# Patient Record
Sex: Female | Born: 1980 | Race: White | Hispanic: No | Smoking: Never smoker
Health system: Southern US, Community
[De-identification: ages and names within clinical notes are randomized; demographics above are authoritative.]

## PROBLEM LIST (undated history)

## (undated) DIAGNOSIS — K219 Gastro-esophageal reflux disease without esophagitis: Secondary | ICD-10-CM

## (undated) DIAGNOSIS — Z87442 Personal history of urinary calculi: Secondary | ICD-10-CM

## (undated) DIAGNOSIS — N201 Calculus of ureter: Secondary | ICD-10-CM

---

## 2012-08-10 DIAGNOSIS — Z87442 Personal history of urinary calculi: Secondary | ICD-10-CM

## 2012-08-10 HISTORY — DX: Personal history of urinary calculi: Z87.442

## 2013-08-10 HISTORY — PX: EXTRACORPOREAL SHOCK WAVE LITHOTRIPSY: SHX1557

## 2013-08-10 HISTORY — PX: CYSTOSCOPY WITH URETEROSCOPY AND STENT PLACEMENT: SHX6377

## 2018-05-10 ENCOUNTER — Other Ambulatory Visit: Payer: Self-pay | Admitting: Urology

## 2018-05-10 ENCOUNTER — Ambulatory Visit (HOSPITAL_COMMUNITY): Payer: Medicaid Other

## 2018-05-10 ENCOUNTER — Ambulatory Visit (HOSPITAL_COMMUNITY): Payer: Medicaid Other | Admitting: Anesthesiology

## 2018-05-10 ENCOUNTER — Ambulatory Visit (HOSPITAL_COMMUNITY)
Admission: AD | Admit: 2018-05-10 | Discharge: 2018-05-10 | Disposition: A | Discharge status: Home / Self Care | Payer: Medicaid Other | Source: Other Acute Inpatient Hospital | Attending: Urology | Admitting: Urology

## 2018-05-10 ENCOUNTER — Encounter (HOSPITAL_COMMUNITY)
Admission: AD | Disposition: A | Discharge status: Home / Self Care | Payer: Self-pay | Source: Other Acute Inpatient Hospital | Attending: Urology

## 2018-05-10 ENCOUNTER — Encounter (HOSPITAL_COMMUNITY): Payer: Self-pay | Admitting: *Deleted

## 2018-05-10 DIAGNOSIS — Z79899 Other long term (current) drug therapy: Secondary | ICD-10-CM | POA: Insufficient documentation

## 2018-05-10 DIAGNOSIS — Z87442 Personal history of urinary calculi: Secondary | ICD-10-CM | POA: Insufficient documentation

## 2018-05-10 DIAGNOSIS — N132 Hydronephrosis with renal and ureteral calculous obstruction: Secondary | ICD-10-CM | POA: Diagnosis not present

## 2018-05-10 DIAGNOSIS — N201 Calculus of ureter: Secondary | ICD-10-CM | POA: Diagnosis present

## 2018-05-10 DIAGNOSIS — N200 Calculus of kidney: Secondary | ICD-10-CM

## 2018-05-10 HISTORY — DX: Personal history of urinary calculi: Z87.442

## 2018-05-10 HISTORY — PX: CYSTOSCOPY W/ URETERAL STENT PLACEMENT: SHX1429

## 2018-05-10 LAB — HCG, SERUM, QUALITATIVE: PREG SERUM: NEGATIVE

## 2018-05-10 SURGERY — CYSTOSCOPY, WITH RETROGRADE PYELOGRAM AND URETERAL STENT INSERTION
Anesthesia: General | Site: Ureter | Laterality: Left

## 2018-05-10 MED ORDER — MIDAZOLAM HCL 2 MG/2ML IJ SOLN
INTRAMUSCULAR | Status: AC
  Filled 2018-05-10: qty 2

## 2018-05-10 MED ORDER — PROPOFOL 10 MG/ML IV BOLUS
INTRAVENOUS | Status: DC | PRN
  Administered 2018-05-10: 40 mg via INTRAVENOUS
  Administered 2018-05-10: 150 mg via INTRAVENOUS

## 2018-05-10 MED ORDER — CEFAZOLIN SODIUM-DEXTROSE 2-4 GM/100ML-% IV SOLN
INTRAVENOUS | Status: AC
  Filled 2018-05-10: qty 100

## 2018-05-10 MED ORDER — FENTANYL CITRATE (PF) 100 MCG/2ML IJ SOLN: 25.0000 ug | INTRAMUSCULAR | Status: DC | PRN

## 2018-05-10 MED ORDER — BELLADONNA ALKALOIDS-OPIUM 16.2-60 MG RE SUPP
RECTAL | Status: DC | PRN
  Administered 2018-05-10: 1 via RECTAL

## 2018-05-10 MED ORDER — DEXAMETHASONE SODIUM PHOSPHATE 10 MG/ML IJ SOLN
INTRAMUSCULAR | Status: DC | PRN
  Administered 2018-05-10: 10 mg via INTRAVENOUS

## 2018-05-10 MED ORDER — ONDANSETRON HCL 4 MG/2ML IJ SOLN
INTRAMUSCULAR | Status: DC | PRN
  Administered 2018-05-10: 4 mg via INTRAVENOUS

## 2018-05-10 MED ORDER — PHENAZOPYRIDINE HCL 200 MG PO TABS: 200.0000 mg | ORAL_TABLET | Freq: Three times a day (TID) | ORAL | 0 refills | Status: DC | PRN

## 2018-05-10 MED ORDER — PHENYLEPHRINE 40 MCG/ML (10ML) SYRINGE FOR IV PUSH (FOR BLOOD PRESSURE SUPPORT)
PREFILLED_SYRINGE | INTRAVENOUS | Status: DC | PRN
  Administered 2018-05-10 (×4): 80 ug via INTRAVENOUS

## 2018-05-10 MED ORDER — OXYCODONE HCL 5 MG/5ML PO SOLN
5.0000 mg | Freq: Once | ORAL | Status: DC | PRN
  Filled 2018-05-10: qty 5

## 2018-05-10 MED ORDER — CEFAZOLIN SODIUM-DEXTROSE 2-4 GM/100ML-% IV SOLN: 2.0000 g | INTRAVENOUS | Status: DC

## 2018-05-10 MED ORDER — SODIUM CHLORIDE 0.9 % IR SOLN
Status: DC | PRN
  Administered 2018-05-10: 3000 mL

## 2018-05-10 MED ORDER — OXYCODONE HCL 5 MG PO TABS: 5.0000 mg | ORAL_TABLET | Freq: Once | ORAL | Status: DC | PRN

## 2018-05-10 MED ORDER — BELLADONNA ALKALOIDS-OPIUM 16.2-30 MG RE SUPP
RECTAL | Status: AC
  Filled 2018-05-10: qty 1

## 2018-05-10 MED ORDER — LACTATED RINGERS IV SOLN
INTRAVENOUS | Status: DC
  Administered 2018-05-10: 16:00:00 via INTRAVENOUS

## 2018-05-10 MED ORDER — PROPOFOL 10 MG/ML IV BOLUS
INTRAVENOUS | Status: AC
  Filled 2018-05-10: qty 20

## 2018-05-10 MED ORDER — FENTANYL CITRATE (PF) 100 MCG/2ML IJ SOLN
INTRAMUSCULAR | Status: AC
  Filled 2018-05-10: qty 2

## 2018-05-10 MED ORDER — MIDAZOLAM HCL 5 MG/5ML IJ SOLN
INTRAMUSCULAR | Status: DC | PRN
  Administered 2018-05-10: 2 mg via INTRAVENOUS

## 2018-05-10 MED ORDER — LIDOCAINE HCL URETHRAL/MUCOSAL 2 % EX GEL
CUTANEOUS | Status: AC
  Filled 2018-05-10: qty 5

## 2018-05-10 MED ORDER — LIDOCAINE 2% (20 MG/ML) 5 ML SYRINGE
INTRAMUSCULAR | Status: DC | PRN
  Administered 2018-05-10: 60 mg via INTRAVENOUS

## 2018-05-10 MED ORDER — TRAMADOL HCL 50 MG PO TABS: 50.0000 mg | ORAL_TABLET | Freq: Four times a day (QID) | ORAL | 0 refills | Status: DC | PRN

## 2018-05-10 MED ORDER — ONDANSETRON HCL 4 MG/2ML IJ SOLN
INTRAMUSCULAR | Status: AC
  Filled 2018-05-10: qty 2

## 2018-05-10 MED ORDER — LIDOCAINE HCL URETHRAL/MUCOSAL 2 % EX GEL
CUTANEOUS | Status: DC | PRN
  Administered 2018-05-10: 1

## 2018-05-10 MED ORDER — FENTANYL CITRATE (PF) 100 MCG/2ML IJ SOLN
INTRAMUSCULAR | Status: DC | PRN
  Administered 2018-05-10: 50 ug via INTRAVENOUS

## 2018-05-10 MED ORDER — LIDOCAINE 2% (20 MG/ML) 5 ML SYRINGE
INTRAMUSCULAR | Status: AC
  Filled 2018-05-10: qty 5

## 2018-05-10 SURGICAL SUPPLY — 17 items
BAG URO CATCHER STRL LF (MISCELLANEOUS) ×2 IMPLANT
BASKET ZERO TIP NITINOL 2.4FR (BASKET) IMPLANT
CATH URET 5FR 28IN OPEN ENDED (CATHETERS) ×2 IMPLANT
CLOTH BEACON ORANGE TIMEOUT ST (SAFETY) ×2 IMPLANT
EXTRACTOR STONE 1.7FRX115CM (UROLOGICAL SUPPLIES) IMPLANT
GLOVE BIOGEL M STRL SZ7.5 (GLOVE) ×2 IMPLANT
GOWN STRL REUS W/TWL XL LVL3 (GOWN DISPOSABLE) ×2 IMPLANT
GUIDEWIRE ANG ZIPWIRE 038X150 (WIRE) IMPLANT
GUIDEWIRE STR DUAL SENSOR (WIRE) ×2 IMPLANT
MANIFOLD NEPTUNE II (INSTRUMENTS) ×2 IMPLANT
PACK CYSTO (CUSTOM PROCEDURE TRAY) ×2 IMPLANT
SHEATH URETERAL 12FRX28CM (UROLOGICAL SUPPLIES) IMPLANT
SHEATH URETERAL 12FRX35CM (MISCELLANEOUS) IMPLANT
STENT URET 6FRX24 CONTOUR (STENTS) ×2 IMPLANT
TUBING CONNECTING 10 (TUBING) ×2 IMPLANT
TUBING UROLOGY SET (TUBING) IMPLANT
WIRE COONS/BENSON .038X145CM (WIRE) IMPLANT

## 2018-05-10 NOTE — H&P (Signed)
Acute Kidney Stone  HPI: Kirsten West is a 37 year-old female patient who is here for further eval and management of kidney stones.  She was diagnosed with a kidney stone on 05/09/2018. The patient presented to [facility] with symptoms of a kidney stone.   Her pain started about 05/07/2018. The pain is on both sides.   The patient underwent CT scan prior to today's appointment.   The patient relates initially having nausea, flank pain, and fever. She is currently having flank pain. She denies having back pain, groin pain, nausea, vomiting, fever, chills, and voiding symptoms. She has not caught a stone in her urine strainer since her symptoms began.   She has had ESWL and Ureteral Stent for treatment of her stones in the past. This is not her first kidney stone.   History of cystine stones.  Patient has been seen and managed by Dr. Nechama Guard, but recently has not had any follow-up with her urologist. She has had numerous stone surgery since 2014. Prior to this, she did not have any history of kidney stones. She was diagnosed with stones. However, she is not taking any medications for this is currently.   The patient's pain is predominantly in the left flank region. She denies any fevers or chills. She has had some associated nausea and vomiting.     ALLERGIES: None   MEDICATIONS: Percocet 5 mg-325 mg tablet  Tamsulosin Hcl 0.4 mg capsule     GU PSH: No GU PSH    NON-GU PSH: No Non-GU PSH    GU PMH: No GU PMH    NON-GU PMH: No Non-GU PMH    FAMILY HISTORY: nephrolithiasis - Brother, Sister   SOCIAL HISTORY: Marital Status: Single Preferred Language: English; Ethnicity: Not Hispanic Or Latino; Race: White Current Smoking Status: Patient has never smoked.   Tobacco Use Assessment Completed: Used Tobacco in last 30 days? Has never drank.  Drinks 1 caffeinated drink per day. Patient's occupation is/was Unemployed.    REVIEW OF SYSTEMS:    GU Review Female:   Patient reports  get up at night to urinate and stream starts and stops. Patient denies frequent urination, hard to postpone urination, burning /pain with urination, leakage of urine, trouble starting your stream, have to strain to urinate, and being pregnant.  Gastrointestinal (Upper):   Patient denies nausea, vomiting, and indigestion/ heartburn.  Gastrointestinal (Lower):   Patient denies diarrhea and constipation.  Constitutional:   Patient denies fever, night sweats, weight loss, and fatigue.  Skin:   Patient denies skin rash/ lesion and itching.  Eyes:   Patient reports blurred vision. Patient denies double vision.  Ears/ Nose/ Throat:   Patient denies sore throat and sinus problems.  Hematologic/Lymphatic:   Patient denies easy bruising and swollen glands.  Cardiovascular:   Patient denies leg swelling and chest pains.  Respiratory:   Patient denies cough and shortness of breath.  Endocrine:   Patient reports excessive thirst.   Musculoskeletal:   Patient denies back pain and joint pain.  Neurological:   Patient denies headaches and dizziness.  Psychologic:   Patient denies depression and anxiety.   VITAL SIGNS:      05/10/2018 02:09 PM  Weight 121 lb / 54.88 kg  BP 99/70 mmHg  Pulse 103 /min  Temperature 100.1 F / 37.8 C   MULTI-SYSTEM PHYSICAL EXAMINATION:    Constitutional: Well-nourished. No physical deformities. Normally developed. Good grooming.  Neck: Neck symmetrical, not swollen. Normal tracheal position.  Respiratory: Normal  breath sounds. No labored breathing, no use of accessory muscles.   Cardiovascular: Regular rate and rhythm. No murmur, no gallop. Normal temperature, normal extremity pulses, no swelling, no varicosities.   Lymphatic: No enlargement of neck, axillae, groin.  Skin: No paleness, no jaundice, no cyanosis. No lesion, no ulcer, no rash.  Neurologic / Psychiatric: Oriented to time, oriented to place, oriented to person. No depression, no anxiety, no agitation.   Gastrointestinal: No mass, no tenderness, no rigidity, non obese abdomen.  Eyes: Normal conjunctivae. Normal eyelids.  Ears, Nose, Mouth, and Throat: Left ear no scars, no lesions, no masses. Right ear no scars, no lesions, no masses. Nose no scars, no lesions, no masses. Normal hearing. Normal lips.  Musculoskeletal: Normal gait and station of head and neck.     PAST DATA REVIEWED:  Source Of History:  Patient  Records Review:   Previous Doctor Records, Previous Patient Records, POC Tool  X-Ray Review: C.T. Abdomen/Pelvis: Reviewed Films.     PROCEDURES:          Urinalysis w/Scope - 81001 Dipstick Dipstick Cont'd Micro  Color: Yellow Bilirubin: Neg WBC/hpf: 6 - 10/hpf  Appearance: Cloudy Ketones: Neg RBC/hpf: 10 - 20/hpf  Specific Gravity: 1.025 Blood: 2+ Bacteria: Mod (26-50/hpf)  pH: 5.5 Protein: 2+ Cystals: NS (Not Seen)  Glucose: Neg Urobilinogen: 0.2 Casts: NS (Not Seen)    Nitrites: Neg Trichomonas: Not Present    Leukocyte Esterase: Trace Mucous: Present      Epithelial Cells: 10 - 20/hpf      Yeast: NS (Not Seen)      Sperm: Not Present    Notes:            Ketoralac 60mg  - [id number], [id number] Qty: 60 Adm. By: Lyndal Rainbow  Unit: mg Lot No [id number]  Route: IM Exp. Date 09/11/2019  Freq: None Mfgr.:   Site: Left Buttock   ASSESSMENT:      ICD-10 Details  1 GU:   Ureteral calculus - N20.1    PLAN:           Document Letter(s):  Created for Patient: Clinical Summary         Notes:   The patient has an 8 mm left proximal ureteral stone that she is unlikely to pass. I recommended that we place a stent urgently tonight and then have the patient follow-up in several weeks for completion ureteroscopy. The patient is patent this process before, and is familiar with the risk and the benefits. All questions were answered to plan to get this scheduled so that we can proceed to the operating room urgently.

## 2018-05-10 NOTE — Op Note (Addendum)
PATIENT:  Kirsten West  Preoperative diagnosis:  1. Left proximal ureteral stone   Postoperative diagnosis:  1. Left proximal ureteral stone   Procedure: 1. Cystoscopy 2. Left ureteral stent placement (71F x 24 cm JJ) without string 3. Left retrograde pyelography with interpretation   Surgeon: Berniece Salines, M.D. Kevan Rosebush, M.D. (Resident)  Anesthesia: General  Complications: None  EBL: Minimal  Specimens: None  Indication: Kirsten West is a 37 y.o. female with a history of cystine stones s/p prior SWL. She was recently diagnosed with an 8mm left proximal ureteral stone with associated pain, nausea, and fever. After reviewing the management options for treatment, they have elected to proceed with the above surgical procedure(s). We have discussed the potential benefits and risks of the procedure, side effects of the proposed treatment, the likelihood of the patient achieving the goals of the procedure, and any potential problems that might occur during the procedure or recuperation. Informed consent has been obtained.  Findings:  Left proximal ureteral stone visible on KUB and RPG Slightly turbid but nonpurulent hydronephrotic drip-- sent for urine culture Successful left ureteral stent placement without string  Description of procedure:   The patient was taken to the operating room and general anesthesia was induced.  The patient was placed in the dorsal lithotomy position, prepped and draped in the usual sterile fashion, and preoperative antibiotics were administered. A preoperative time-out was performed.   Cystourethroscopy was performed.  The patient's urethra was examined and was normal. The bladder was then systematically examined in its entirety. There was no evidence for any bladder tumors, stones, or other mucosal pathology.    Attention then turned to the left ureteral orifice and a ureteral catheter was used to intubate the ureteral orifice.  There was a  turbid but nonpurulent hydronephrotic drip which was collected and sent for urine culture. Omnipaque contrast was injected through the ureteral catheter and a retrograde pyelogram which revealed the above findings.   A Sensor guidewire was then advanced up the left ureter into the renal pelvis under fluoroscopic guidance.  The wire was then backloaded through the cystoscope and a 71F x 24 cm JJ ureteral stent without string was advance over the wire using Seldinger technique.  The stent was positioned appropriately under fluoroscopic and cystoscopic guidance.  The wire was then removed with an adequate stent curl noted in the renal pelvis as well as in the bladder.  The bladder was then emptied and the procedure ended. A B&O suppository was placed. The patient appeared to tolerate the procedure well and without complications.  The patient was able to be awakened and transferred to the recovery unit in satisfactory condition.

## 2018-05-10 NOTE — Transfer of Care (Signed)
Immediate Anesthesia Transfer of Care Note  Patient: Kirsten West  Procedure(s) Performed: CYSTOSCOPY WITH RETROGRADE PYELOGRAM/URETERAL STENT PLACEMENT (Left Ureter)  Patient Location: PACU  Anesthesia Type:General  Level of Consciousness: drowsy and patient cooperative  Airway & Oxygen Therapy: Patient Spontanous Breathing  Post-op Assessment: Report given to RN and Post -op Vital signs reviewed and stable  Post vital signs: Reviewed and stable  Last Vitals:  Vitals Value Taken Time  BP 88/56 05/10/2018  7:20 PM  Temp    Pulse 100 05/10/2018  7:22 PM  Resp 21 05/10/2018  7:22 PM  SpO2 100 % 05/10/2018  7:22 PM  Vitals shown include unvalidated device data.  Last Pain:  Vitals:   05/10/18 1600  TempSrc:   PainSc: 1       Patients Stated Pain Goal: 3 (05/10/18 1600)  Complications: No apparent anesthesia complications

## 2018-05-10 NOTE — Anesthesia Preprocedure Evaluation (Signed)
Anesthesia Evaluation  Patient identified by MRN, date of birth, ID band Patient awake    Reviewed: Allergy & Precautions, NPO status , Patient's Chart, lab work & pertinent test results  History of Anesthesia Complications Negative for: history of anesthetic complications  Airway Mallampati: III  TM Distance: <3 FB Neck ROM: Full  Mouth opening: Limited Mouth Opening  Dental  (+) Teeth Intact   Pulmonary neg pulmonary ROS,    breath sounds clear to auscultation       Cardiovascular negative cardio ROS   Rhythm:Regular     Neuro/Psych negative neurological ROS  negative psych ROS   GI/Hepatic negative GI ROS, Neg liver ROS,   Endo/Other  negative endocrine ROS  Renal/GU Renal disease     Musculoskeletal negative musculoskeletal ROS (+)   Abdominal   Peds  Hematology negative hematology ROS (+)   Anesthesia Other Findings   Reproductive/Obstetrics                             Anesthesia Physical Anesthesia Plan  ASA: I  Anesthesia Plan: General   Post-op Pain Management:    Induction: Intravenous  PONV Risk Score and Plan: 3 and Ondansetron and Dexamethasone  Airway Management Planned: LMA  Additional Equipment: None  Intra-op Plan:   Post-operative Plan: Extubation in OR  Informed Consent: I have reviewed the patients History and Physical, chart, labs and discussed the procedure including the risks, benefits and alternatives for the proposed anesthesia with the patient or authorized representative who has indicated his/her understanding and acceptance.   Dental advisory given  Plan Discussed with: CRNA and Surgeon  Anesthesia Plan Comments:         Anesthesia Quick Evaluation

## 2018-05-10 NOTE — Anesthesia Postprocedure Evaluation (Signed)
Anesthesia Post Note  Patient: Kirsten West  Procedure(s) Performed: CYSTOSCOPY WITH RETROGRADE PYELOGRAM/URETERAL STENT PLACEMENT (Left Ureter)     Patient location during evaluation: PACU Anesthesia Type: General Level of consciousness: awake and alert Pain management: pain level controlled Vital Signs Assessment: post-procedure vital signs reviewed and stable Respiratory status: spontaneous breathing, nonlabored ventilation, respiratory function stable and patient connected to nasal cannula oxygen Cardiovascular status: blood pressure returned to baseline and stable Postop Assessment: no apparent nausea or vomiting Anesthetic complications: no    Last Vitals:  Vitals:   05/10/18 1945 05/10/18 2000  BP: (!) 87/77 100/70  Pulse: 89 95  Resp: (!) 24 18  Temp:  37 C  SpO2: 99% 98%    Last Pain:  Vitals:   05/10/18 2000  TempSrc:   PainSc: 0-No pain                 Lacy Taglieri S

## 2018-05-10 NOTE — Discharge Instructions (Signed)
DISCHARGE INSTRUCTIONS FOR KIDNEY STONE/URETERAL STENT   MEDICATIONS:  1.  Resume all your other meds from home - except do not take any extra narcotic pain meds that you may have at home.  2. Pyridium is to help with the burning/stinging when you urinate. 3. Tramadol is for moderate/severe pain, otherwise taking upto 1000 mg every 6 hours of plainTylenol will help treat your pain.     ACTIVITY:  1. No strenuous activity x 1week  2. No driving while on narcotic pain medications  3. Drink plenty of water  4. Continue to walk at home - you can still get blood clots when you are at home, so keep active, but don't over do it.  5. May return to work/school tomorrow or when you feel ready   BATHING:  1. You can shower and we recommend daily showers    SIGNS/SYMPTOMS TO CALL:  Please call us if you have a fever greater than 101.5, uncontrolled nausea/vomiting, uncontrolled pain, dizziness, unable to urinate, bloody urine, chest pain, shortness of breath, leg swelling, leg pain, redness around wound, drainage from wound, or any other concerns or questions.   You can reach [place] at [phone].   FOLLOW-UP:  1. You will be scheduled for removal of your stone in ~2 weeks.

## 2018-05-10 NOTE — Anesthesia Procedure Notes (Signed)
Procedure Name: LMA Insertion Date/Time: 05/10/2018 6:53 PM Performed by: Epimenio Sarin, CRNA Pre-anesthesia Checklist: Patient identified, Emergency Drugs available, Suction available, Patient being monitored and Timeout performed Patient Re-evaluated:Patient Re-evaluated prior to induction Oxygen Delivery Method: Circle system utilized Preoxygenation: Pre-oxygenation with 100% oxygen Induction Type: IV induction Ventilation: Mask ventilation without difficulty LMA: LMA inserted LMA Size: 3.0 Number of attempts: 2 Dental Injury: Teeth and Oropharynx as per pre-operative assessment  Comments: Easy mask. LMA 4 gastric port too large for patient pharyngeal opening/recessed jaw. LMA 3 easily inserted.

## 2018-05-11 ENCOUNTER — Encounter (HOSPITAL_COMMUNITY): Payer: Self-pay | Admitting: Urology

## 2018-05-12 ENCOUNTER — Other Ambulatory Visit: Payer: Self-pay | Admitting: Urology

## 2018-05-13 LAB — URINE CULTURE

## 2018-05-23 ENCOUNTER — Other Ambulatory Visit: Payer: Self-pay

## 2018-05-23 ENCOUNTER — Encounter (HOSPITAL_BASED_OUTPATIENT_CLINIC_OR_DEPARTMENT_OTHER): Payer: Self-pay | Admitting: *Deleted

## 2018-05-23 NOTE — Progress Notes (Signed)
Spoke w/ pt via phone for pre-op interview.  Npo after mn.  Arrive at 0800.  Needs istat 8.  Negative serum pregnancy dated 05-10-2018 in chart and epic.  May take tramadol/ tylenol am dos w/ sips of water.

## 2018-05-26 ENCOUNTER — Encounter (HOSPITAL_BASED_OUTPATIENT_CLINIC_OR_DEPARTMENT_OTHER)
Admission: RE | Disposition: A | Discharge status: Home / Self Care | Payer: Self-pay | Source: Ambulatory Visit | Attending: Urology

## 2018-05-26 ENCOUNTER — Encounter (HOSPITAL_BASED_OUTPATIENT_CLINIC_OR_DEPARTMENT_OTHER): Payer: Self-pay | Admitting: Anesthesiology

## 2018-05-26 ENCOUNTER — Ambulatory Visit (HOSPITAL_BASED_OUTPATIENT_CLINIC_OR_DEPARTMENT_OTHER): Payer: Medicaid Other | Admitting: Anesthesiology

## 2018-05-26 ENCOUNTER — Ambulatory Visit (HOSPITAL_BASED_OUTPATIENT_CLINIC_OR_DEPARTMENT_OTHER)
Admission: RE | Admit: 2018-05-26 | Discharge: 2018-05-26 | Disposition: A | Discharge status: Home / Self Care | Payer: Medicaid Other | Source: Ambulatory Visit | Attending: Urology | Admitting: Urology

## 2018-05-26 DIAGNOSIS — N200 Calculus of kidney: Secondary | ICD-10-CM

## 2018-05-26 DIAGNOSIS — Z79891 Long term (current) use of opiate analgesic: Secondary | ICD-10-CM | POA: Insufficient documentation

## 2018-05-26 DIAGNOSIS — N201 Calculus of ureter: Secondary | ICD-10-CM | POA: Insufficient documentation

## 2018-05-26 HISTORY — PX: CYSTOSCOPY/URETEROSCOPY/HOLMIUM LASER/STENT PLACEMENT: SHX6546

## 2018-05-26 HISTORY — DX: Calculus of ureter: N20.1

## 2018-05-26 HISTORY — DX: Gastro-esophageal reflux disease without esophagitis: K21.9

## 2018-05-26 LAB — POCT I-STAT, CHEM 8
BUN: 7 mg/dL (ref 6–20)
CHLORIDE: 104 mmol/L (ref 98–111)
CREATININE: 0.8 mg/dL (ref 0.44–1.00)
Calcium, Ion: 1.25 mmol/L (ref 1.15–1.40)
Glucose, Bld: 85 mg/dL (ref 70–99)
HEMATOCRIT: 40 % (ref 36.0–46.0)
Hemoglobin: 13.6 g/dL (ref 12.0–15.0)
POTASSIUM: 3.7 mmol/L (ref 3.5–5.1)
SODIUM: 143 mmol/L (ref 135–145)
TCO2: 29 mmol/L (ref 22–32)

## 2018-05-26 SURGERY — CYSTOSCOPY/URETEROSCOPY/HOLMIUM LASER/STENT PLACEMENT
Anesthesia: General | Laterality: Left

## 2018-05-26 MED ORDER — KETOROLAC TROMETHAMINE 30 MG/ML IJ SOLN
INTRAMUSCULAR | Status: AC
  Filled 2018-05-26: qty 1

## 2018-05-26 MED ORDER — PHENAZOPYRIDINE HCL 200 MG PO TABS: 200.0000 mg | ORAL_TABLET | Freq: Three times a day (TID) | ORAL | 0 refills | Status: DC | PRN

## 2018-05-26 MED ORDER — TRAMADOL HCL 50 MG PO TABS: 50.0000 mg | ORAL_TABLET | Freq: Four times a day (QID) | ORAL | 0 refills | Status: AC | PRN

## 2018-05-26 MED ORDER — ONDANSETRON HCL 4 MG/2ML IJ SOLN
INTRAMUSCULAR | Status: AC
  Filled 2018-05-26: qty 2

## 2018-05-26 MED ORDER — LIDOCAINE 2% (20 MG/ML) 5 ML SYRINGE
INTRAMUSCULAR | Status: DC | PRN
  Administered 2018-05-26: 80 mg via INTRAVENOUS

## 2018-05-26 MED ORDER — IOHEXOL 300 MG/ML  SOLN
INTRAMUSCULAR | Status: DC | PRN
  Administered 2018-05-26: 20 mL via URETHRAL

## 2018-05-26 MED ORDER — FENTANYL CITRATE (PF) 100 MCG/2ML IJ SOLN
INTRAMUSCULAR | Status: DC | PRN
  Administered 2018-05-26 (×2): 50 ug via INTRAVENOUS

## 2018-05-26 MED ORDER — CIPROFLOXACIN HCL 500 MG PO TABS: 500.0000 mg | ORAL_TABLET | Freq: Once | ORAL | 0 refills | Status: AC

## 2018-05-26 MED ORDER — FENTANYL CITRATE (PF) 100 MCG/2ML IJ SOLN
INTRAMUSCULAR | Status: AC
  Filled 2018-05-26: qty 2

## 2018-05-26 MED ORDER — DEXAMETHASONE SODIUM PHOSPHATE 10 MG/ML IJ SOLN
INTRAMUSCULAR | Status: DC | PRN
  Administered 2018-05-26: 10 mg via INTRAVENOUS

## 2018-05-26 MED ORDER — MIDAZOLAM HCL 2 MG/2ML IJ SOLN
INTRAMUSCULAR | Status: DC | PRN
  Administered 2018-05-26: 2 mg via INTRAVENOUS

## 2018-05-26 MED ORDER — GENTAMICIN SULFATE 40 MG/ML IJ SOLN
240.0000 mg | INTRAVENOUS | Status: AC
  Administered 2018-05-26: 240 mg via INTRAVENOUS
  Filled 2018-05-26 (×2): qty 6

## 2018-05-26 MED ORDER — PROPOFOL 10 MG/ML IV BOLUS
INTRAVENOUS | Status: DC | PRN
  Administered 2018-05-26: 160 mg via INTRAVENOUS

## 2018-05-26 MED ORDER — PROMETHAZINE HCL 25 MG/ML IJ SOLN
6.2500 mg | INTRAMUSCULAR | Status: DC | PRN
  Filled 2018-05-26: qty 1

## 2018-05-26 MED ORDER — DEXAMETHASONE SODIUM PHOSPHATE 10 MG/ML IJ SOLN
INTRAMUSCULAR | Status: AC
  Filled 2018-05-26: qty 1

## 2018-05-26 MED ORDER — KETOROLAC TROMETHAMINE 30 MG/ML IJ SOLN
INTRAMUSCULAR | Status: DC | PRN
  Administered 2018-05-26: 30 mg via INTRAVENOUS

## 2018-05-26 MED ORDER — LIDOCAINE 2% (20 MG/ML) 5 ML SYRINGE
INTRAMUSCULAR | Status: AC
  Filled 2018-05-26: qty 5

## 2018-05-26 MED ORDER — HYDROMORPHONE HCL 1 MG/ML IJ SOLN
0.2500 mg | INTRAMUSCULAR | Status: DC | PRN
  Filled 2018-05-26: qty 0.5

## 2018-05-26 MED ORDER — LACTATED RINGERS IV SOLN
INTRAVENOUS | Status: DC
  Administered 2018-05-26 (×2): via INTRAVENOUS
  Filled 2018-05-26: qty 1000

## 2018-05-26 MED ORDER — MIDAZOLAM HCL 2 MG/2ML IJ SOLN
INTRAMUSCULAR | Status: AC
  Filled 2018-05-26: qty 2

## 2018-05-26 MED ORDER — BELLADONNA ALKALOIDS-OPIUM 16.2-60 MG RE SUPP
RECTAL | Status: DC | PRN
  Administered 2018-05-26: 1 via RECTAL

## 2018-05-26 MED ORDER — PROPOFOL 10 MG/ML IV BOLUS
INTRAVENOUS | Status: AC
  Filled 2018-05-26: qty 20

## 2018-05-26 MED ORDER — ONDANSETRON HCL 4 MG/2ML IJ SOLN
INTRAMUSCULAR | Status: DC | PRN
  Administered 2018-05-26: 4 mg via INTRAVENOUS

## 2018-05-26 MED ORDER — SODIUM CHLORIDE 0.9 % IR SOLN
Status: DC | PRN
  Administered 2018-05-26: 3000 mL via INTRAVESICAL

## 2018-05-26 MED ORDER — BELLADONNA ALKALOIDS-OPIUM 16.2-60 MG RE SUPP
RECTAL | Status: AC
  Filled 2018-05-26: qty 1

## 2018-05-26 SURGICAL SUPPLY — 23 items
BAG DRAIN URO-CYSTO SKYTR STRL (DRAIN) ×3 IMPLANT
BASKET LASER NITINOL 1.9FR (BASKET) IMPLANT
BASKET ZERO TIP NITINOL 2.4FR (BASKET) ×3 IMPLANT
CATH URET 5FR 28IN OPEN ENDED (CATHETERS) ×3 IMPLANT
CATH URET DUAL LUMEN 6-10FR 50 (CATHETERS) IMPLANT
CLOTH BEACON ORANGE TIMEOUT ST (SAFETY) ×3 IMPLANT
EXTRACTOR STONE 1.7FRX115CM (UROLOGICAL SUPPLIES) IMPLANT
FIBER LASER TRAC TIP (UROLOGICAL SUPPLIES) ×3 IMPLANT
GLOVE BIO SURGEON STRL SZ7.5 (GLOVE) ×3 IMPLANT
GOWN STRL REUS W/TWL XL LVL3 (GOWN DISPOSABLE) ×3 IMPLANT
GUIDEWIRE ANG ZIPWIRE 038X150 (WIRE) IMPLANT
GUIDEWIRE STR DUAL SENSOR (WIRE) ×6 IMPLANT
INFUSOR MANOMETER BAG 3000ML (MISCELLANEOUS) IMPLANT
IV NS IRRIG 3000ML ARTHROMATIC (IV SOLUTION) ×6 IMPLANT
KIT TURNOVER CYSTO (KITS) ×3 IMPLANT
MANIFOLD NEPTUNE II (INSTRUMENTS) IMPLANT
NS IRRIG 500ML POUR BTL (IV SOLUTION) ×3 IMPLANT
PACK CYSTO (CUSTOM PROCEDURE TRAY) ×3 IMPLANT
SHEATH URETERAL 12FRX35CM (MISCELLANEOUS) ×3 IMPLANT
STENT URET 6FRX24 CONTOUR (STENTS) ×3 IMPLANT
TUBE CONNECTING 12'X1/4 (SUCTIONS) ×1
TUBE CONNECTING 12X1/4 (SUCTIONS) ×2 IMPLANT
TUBING UROLOGY SET (TUBING) ×3 IMPLANT

## 2018-05-26 NOTE — Anesthesia Procedure Notes (Signed)
Procedure Name: LMA Insertion Date/Time: 05/26/2018 9:43 AM Performed by: Tyrone Nine, CRNA Pre-anesthesia Checklist: Patient identified, Timeout performed, Emergency Drugs available, Suction available and Patient being monitored Patient Re-evaluated:Patient Re-evaluated prior to induction Oxygen Delivery Method: Circle system utilized Preoxygenation: Pre-oxygenation with 100% oxygen Induction Type: IV induction Ventilation: Mask ventilation without difficulty LMA: LMA inserted LMA Size: 3.0 Number of attempts: 1 Placement Confirmation: CO2 detector,  positive ETCO2 and breath sounds checked- equal and bilateral Tube secured with: Tape Dental Injury: Teeth and Oropharynx as per pre-operative assessment

## 2018-05-26 NOTE — H&P (Signed)
Acute Kidney Stone  HPI: Kirsten West is a 37 year-old female patient who is here for further eval and management of kidney stones.  She was diagnosed with a kidney stone on 05/09/2018. The patient presented to [facility] with symptoms of a kidney stone.   Her pain started about 05/07/2018. The pain is on both sides.   The patient underwent CT scan prior to today's appointment.   The patient relates initially having nausea, flank pain, and fever. She is currently having flank pain. She denies having back pain, groin pain, nausea, vomiting, fever, chills, and voiding symptoms. She has not caught a stone in her urine strainer since her symptoms began.   She has had ESWL and Ureteral Stent for treatment of her stones in the past. This is not her first kidney stone.   History of cystine stones.  Patient has been seen and managed by Dr. Nechama Guard, but recently has not had any follow-up with her urologist. She has had numerous stone surgery since 2014. Prior to this, she did not have any history of kidney stones. She was diagnosed with stones. However, she is not taking any medications for this is currently.   The patient's pain is predominantly in the left flank region. She denies any fevers or chills. She has had some associated nausea and vomiting.     ALLERGIES: None   MEDICATIONS: Percocet 5 mg-325 mg tablet  Tamsulosin Hcl 0.4 mg capsule     GU PSH: No GU PSH    NON-GU PSH: No Non-GU PSH    GU PMH: No GU PMH    NON-GU PMH: No Non-GU PMH    FAMILY HISTORY: nephrolithiasis - Brother, Sister   SOCIAL HISTORY: Marital Status: Single Preferred Language: English; Ethnicity: Not Hispanic Or Latino; Race: White Current Smoking Status: Patient has never smoked.   Tobacco Use Assessment Completed: Used Tobacco in last 30 days? Has never drank.  Drinks 1 caffeinated drink per day. Patient's occupation is/was Unemployed.    REVIEW OF SYSTEMS:    GU Review Female:   Patient reports  get up at night to urinate and stream starts and stops. Patient denies frequent urination, hard to postpone urination, burning /pain with urination, leakage of urine, trouble starting your stream, have to strain to urinate, and being pregnant.  Gastrointestinal (Upper):   Patient denies nausea, vomiting, and indigestion/ heartburn.  Gastrointestinal (Lower):   Patient denies diarrhea and constipation.  Constitutional:   Patient denies fever, night sweats, weight loss, and fatigue.  Skin:   Patient denies skin rash/ lesion and itching.  Eyes:   Patient reports blurred vision. Patient denies double vision.  Ears/ Nose/ Throat:   Patient denies sore throat and sinus problems.  Hematologic/Lymphatic:   Patient denies easy bruising and swollen glands.  Cardiovascular:   Patient denies leg swelling and chest pains.  Respiratory:   Patient denies cough and shortness of breath.  Endocrine:   Patient reports excessive thirst.   Musculoskeletal:   Patient denies back pain and joint pain.  Neurological:   Patient denies headaches and dizziness.  Psychologic:   Patient denies depression and anxiety.   VITAL SIGNS:      05/10/2018 02:09 PM  Weight 121 lb / 54.88 kg  BP 99/70 mmHg  Pulse 103 /min  Temperature 100.1 F / 37.8 C   MULTI-SYSTEM PHYSICAL EXAMINATION:    Constitutional: Well-nourished. No physical deformities. Normally developed. Good grooming.  Neck: Neck symmetrical, not swollen. Normal tracheal position.  Respiratory: Normal  breath sounds. No labored breathing, no use of accessory muscles.   Cardiovascular: Regular rate and rhythm. No murmur, no gallop. Normal temperature, normal extremity pulses, no swelling, no varicosities.   Lymphatic: No enlargement of neck, axillae, groin.  Skin: No paleness, no jaundice, no cyanosis. No lesion, no ulcer, no rash.  Neurologic / Psychiatric: Oriented to time, oriented to place, oriented to person. No depression, no anxiety, no agitation.   Gastrointestinal: No mass, no tenderness, no rigidity, non obese abdomen.  Eyes: Normal conjunctivae. Normal eyelids.  Ears, Nose, Mouth, and Throat: Left ear no scars, no lesions, no masses. Right ear no scars, no lesions, no masses. Nose no scars, no lesions, no masses. Normal hearing. Normal lips.  Musculoskeletal: Normal gait and station of head and neck.     PAST DATA REVIEWED:  Source Of History:  Patient  Records Review:   Previous Doctor Records, Previous Patient Records, POC Tool  X-Ray Review: C.T. Abdomen/Pelvis: Reviewed Films.     PROCEDURES:          Urinalysis w/Scope - 81001 Dipstick Dipstick Cont'd Micro  Color: Yellow Bilirubin: Neg WBC/hpf: 6 - 10/hpf  Appearance: Cloudy Ketones: Neg RBC/hpf: 10 - 20/hpf  Specific Gravity: 1.025 Blood: 2+ Bacteria: Mod (26-50/hpf)  pH: 5.5 Protein: 2+ Cystals: NS (Not Seen)  Glucose: Neg Urobilinogen: 0.2 Casts: NS (Not Seen)    Nitrites: Neg Trichomonas: Not Present    Leukocyte Esterase: Trace Mucous: Present      Epithelial Cells: 10 - 20/hpf      Yeast: NS (Not Seen)      Sperm: Not Present    Notes:            Ketoralac 60mg  - [id number], [id number] Qty: 60 Adm. By: Lyndal Rainbow  Unit: mg Lot No [id number]  Route: IM Exp. Date 09/11/2019  Freq: None Mfgr.:   Site: Left Buttock   ASSESSMENT:      ICD-10 Details  1 GU:   Ureteral calculus - N20.1    PLAN:           Document Letter(s):  Created for Patient: Clinical Summary         Notes:   The patient has an 8 mm left proximal ureteral stone that she is unlikely to pass. I recommended that we place a stent urgently tonight and then have the patient follow-up in several weeks for completion ureteroscopy. The patient is patent this process before, and is familiar with the risk and the benefits. All questions were answered to plan to get this scheduled so that we can proceed to the operating room urgentl

## 2018-05-26 NOTE — Discharge Instructions (Signed)
DISCHARGE INSTRUCTIONS FOR KIDNEY STONE/URETERAL STENT   MEDICATIONS:  1.  Resume all your other meds from home - except do not take any extra narcotic pain meds that you may have at home.  2. Pyridium is to help with the burning/stinging when you urinate. 3. Tramadol is for moderate/severe pain, otherwise taking upto 1000 mg every 6 hours of plainTylenol will help treat your pain.   4. Take Cipro one hour prior to removal of your stent.   ACTIVITY:  1. No strenuous activity x 1week  2. No driving while on narcotic pain medications  3. Drink plenty of water  4. Continue to walk at home - you can still get blood clots when you are at home, so keep active, but don't over do it.  5. May return to work/school tomorrow or when you feel ready   BATHING:  1. You can shower and we recommend daily showers  2. You have a string coming from your urethra: The stent string is attached to your ureteral stent. Do not pull on this.   SIGNS/SYMPTOMS TO CALL:  Please call us if you have a fever greater than 101.5, uncontrolled nausea/vomiting, uncontrolled pain, dizziness, unable to urinate, bloody urine, chest pain, shortness of breath, leg swelling, leg pain, redness around wound, drainage from wound, or any other concerns or questions.   You can reach [place] at [phone].   FOLLOW-UP:  1. You have an appointment in 6 weeks with a ultrasound of your kidneys prior.   2. You have a string attached to your stent, you may remove it on Oct 21st. To do this, pull the strings until the stents are completely removed. You may feel an odd sensation in your back.   Post Anesthesia Home Care Instructions  Activity: Get plenty of rest for the remainder of the day. A responsible individual must stay with you for 24 hours following the procedure.  For the next 24 hours, DO NOT: -Drive a car -Advertising copywriter -Drink alcoholic beverages -Take any medication unless instructed by your physician -Make any legal  decisions or sign important papers.  Meals: Start with liquid foods such as gelatin or soup. Progress to regular foods as tolerated. Avoid greasy, spicy, heavy foods. If nausea and/or vomiting occur, drink only clear liquids until the nausea and/or vomiting subsides. Call your physician if vomiting continues.  Special Instructions/Symptoms: Your throat may feel dry or sore from the anesthesia or the breathing tube placed in your throat during surgery. If this causes discomfort, gargle with warm salt water. The discomfort should disappear within 24 hours.  If you had a scopolamine patch placed behind your ear for the management of post- operative nausea and/or vomiting:  1. The medication in the patch is effective for 72 hours, after which it should be removed.  Wrap patch in a tissue and discard in the trash. Wash hands thoroughly with soap and water. 2. You may remove the patch earlier than 72 hours if you experience unpleasant side effects which may include dry mouth, dizziness or visual disturbances. 3. Avoid touching the patch. Wash your hands with soap and water after contact with the patch.

## 2018-05-26 NOTE — Transfer of Care (Signed)
Immediate Anesthesia Transfer of Care Note  Patient: Kirsten West  Procedure(s) Performed: CYSTOSCOPY/LEFT URETEROSCOPY/HOLMIUM LASER/STENT EXCHANGE (Left )  Patient Location: PACU  Anesthesia Type:General  Level of Consciousness: awake, alert , oriented and patient cooperative  Airway & Oxygen Therapy: Patient Spontanous Breathing and Patient connected to nasal cannula oxygen  Post-op Assessment: Report given to RN and Post -op Vital signs reviewed and stable  Post vital signs: Reviewed and stable  Last Vitals:  Vitals Value Taken Time  BP 158/117 05/26/2018 10:50 AM  Temp    Pulse 81 05/26/2018 10:51 AM  Resp    SpO2 100 % 05/26/2018 10:51 AM  Vitals shown include unvalidated device data.  Last Pain:  Vitals:   05/26/18 0824  TempSrc:   PainSc: 0-No pain      Patients Stated Pain Goal: 3 (05/26/18 5621)  Complications: No apparent anesthesia complications

## 2018-05-26 NOTE — Op Note (Signed)
Preoperative diagnosis: left ureteral calculus  Postoperative diagnosis: left ureteral calculus  Procedure:  1. Cystoscopy 2. left ureteroscopy and stone removal 3. Ureteroscopic laser lithotripsy 4. left 19F x 24 ureteral stent exchange 5. left retrograde pyelography with interpretation  Surgeon: Crist Fat, MD  Anesthesia: General  Complications: None  Intraoperative findings: left retrograde pyelography demonstrated no ureteral filling defect.  There was a filling defect noted in the lower pole of the kidney.  There was no hydronephrosis the ureteral caliber was normal.  EBL: Minimal  Specimens: 1. left ureteral calculus  Disposition of specimens: [facility] for stone analysis  Indication: Kirsten West is a 37 y.o.   patient with a left proximal ureteral stone who presented to clinic several weeks back with colicky symptoms.  A stent was placed at that time.  After reviewing the management options for treatment, the patient elected to proceed with the above surgical procedure(s). We have discussed the potential benefits and risks of the procedure, side effects of the proposed treatment, the likelihood of the patient achieving the goals of the procedure, and any potential problems that might occur during the procedure or recuperation. Informed consent has been obtained.   Description of procedure:  The patient was taken to the operating room and general anesthesia was induced.  The patient was placed in the dorsal lithotomy position, prepped and draped in the usual sterile fashion, and preoperative antibiotics were administered. A preoperative time-out was performed.   Cystourethroscopy was performed.  The patient's urethra was examined and was normal. . The bladder was then systematically examined in its entirety. There was no evidence for any bladder tumors, stones, or other mucosal pathology.    The stent emanating from the patient's left ureteral  orifice was then grasped and brought to the urethral meatus.  A 0.038 wire was then advanced up through the stent and into the left renal pelvis, then remove the stent over the wire.  I then advanced a 4/6 French rigid ureteroscope through the patient's urethra and into the left ureter under visual guidance.  I advanced this up to the mid ureter and performed retrograde pyelogram with the above findings.  I then advanced up to the renal pelvis and slowly backed out under visual guidance noting no significant ureteral abnormalities.  I advanced the second sensor wire through the semirigid ureteroscope leaving it in the renal pelvis and removing the scope over the wire.  I then advanced a 12/14 French ureteral access sheath over the wire into the proximal ureter removing the inner portion of the sheath and the wire simultaneously.  I then passed a flexible ureteroscope through the access sheath and into the left renal pelvis.  Pyeloscopy demonstrated a stone in the lower pole consistent with the patient's retrograde pyelogram.  I then used a basket to remove remove the stone into the upper pole.  I then lasered the stone using a 200 m fiber with settings of 10 Hz and 1.0 J into smaller pieces which I was then able to grasp with the basket and removed through the access sheath without any significant difficulty.   I then saw a stone with that the midpole calyx it was a papillary tip calcification which I also lasered.  All remaining stone fragments were irrigated out and aspirated through the scope.  Once I completed stone removal inspection of the kidney demonstrated no residual stone fragments or other abnormalities.  I then slowly backed out the ureteroscope removing the access she  simultaneously.  Once at the distal part of the ureter I removed the scope and started the inner portion of the access sheath so that I could inject contrast to help guide stent placement.  There was no extravasation of the contrast  or any other abnormality.  The access sheath was then fully removed.  I then advanced a 6 [place] x24 cm double-J ureteral stent over the wire and under fluoroscopic guidance advanced up to the renal pelvis before slowly backing out the wire.  Once the stent was noted to be curled nicely in the left renal pelvis I advanced the stent to the urethral meatus and with gentle pressure on the stent removed the wire.  I then inserted the beak of the cystoscope into the patient's bladder to ensure that the curl of the stent was in the bladder.  The bladder was emptied.  Stent tether was tucked in the patient's vagina.  The B&O suppository was inserted into the patient's rectum.  She was subsequently extubated return the PACU in good condition.  The wire was then backloaded through the cystoscope and a ureteral stent was advance over the wire using Seldinger technique.  The stent was positioned appropriately under fluoroscopic and cystoscopic guidance.  The wire was then removed with an adequate stent curl noted in the renal pelvis as well as in the bladder.   Disposition: The tether of the stent was left on and tucked inside the patient's vagina.  Instructions for removing the stent have been provided to the patient. The patient has been scheduled for followup in 6 weeks with a renal ultrasound.

## 2018-05-26 NOTE — Anesthesia Postprocedure Evaluation (Signed)
Anesthesia Post Note  Patient: Kirsten West  Procedure(s) Performed: CYSTOSCOPY/LEFT URETEROSCOPY/HOLMIUM LASER/STENT EXCHANGE (Left )     Patient location during evaluation: PACU Anesthesia Type: General Level of consciousness: sedated Pain management: pain level controlled Vital Signs Assessment: post-procedure vital signs reviewed and stable Respiratory status: spontaneous breathing and respiratory function stable Cardiovascular status: stable Postop Assessment: no apparent nausea or vomiting Anesthetic complications: no    Last Vitals:  Vitals:   05/26/18 1130 05/26/18 1215  BP:  (!) 130/92  Pulse: 71 88  Resp: 12 14  Temp:  36.8 C  SpO2: 100%     Last Pain:  Vitals:   05/26/18 1200  TempSrc:   PainSc: 0-No pain                 Trica Usery DANIEL

## 2018-05-26 NOTE — Interval H&P Note (Signed)
History and Physical Interval Note:  05/26/2018 9:26 AM  Kirsten West  has presented today for surgery, with the diagnosis of LEFT URETERAL STONE  The various methods of treatment have been discussed with the patient and family. After consideration of risks, benefits and other options for treatment, the patient has consented to  Procedure(s): CYSTOSCOPY/LEFT URETEROSCOPY/HOLMIUM LASER/STENT EXCHANGE (Left) as a surgical intervention .  The patient's history has been reviewed, patient examined, no change in status, stable for surgery.  I have reviewed the patient's chart and labs.  Questions were answered to the patient's satisfaction.     Berniece Salines W

## 2018-05-26 NOTE — Anesthesia Preprocedure Evaluation (Addendum)
Anesthesia Evaluation  Patient identified by MRN, date of birth, ID band Patient awake    Reviewed: Allergy & Precautions, NPO status , Patient's Chart, lab work & pertinent test results  History of Anesthesia Complications Negative for: history of anesthetic complications  Airway Mallampati: III  TM Distance: <3 FB Neck ROM: Full  Mouth opening: Limited Mouth Opening  Dental  (+) Teeth Intact, Dental Advisory Given   Pulmonary neg pulmonary ROS,    breath sounds clear to auscultation       Cardiovascular negative cardio ROS   Rhythm:Regular     Neuro/Psych negative neurological ROS  negative psych ROS   GI/Hepatic Neg liver ROS, GERD  ,  Endo/Other  negative endocrine ROS  Renal/GU Renal disease     Musculoskeletal negative musculoskeletal ROS (+)   Abdominal   Peds  Hematology negative hematology ROS (+)   Anesthesia Other Findings   Reproductive/Obstetrics                            Anesthesia Physical  Anesthesia Plan  ASA: I  Anesthesia Plan: General   Post-op Pain Management:    Induction: Intravenous  PONV Risk Score and Plan: 3 and Ondansetron and Dexamethasone  Airway Management Planned: LMA  Additional Equipment: None  Intra-op Plan:   Post-operative Plan: Extubation in OR  Informed Consent: I have reviewed the patients History and Physical, chart, labs and discussed the procedure including the risks, benefits and alternatives for the proposed anesthesia with the patient or authorized representative who has indicated his/her understanding and acceptance.   Dental advisory given  Plan Discussed with: CRNA and Surgeon  Anesthesia Plan Comments:         Anesthesia Quick Evaluation

## 2018-05-27 ENCOUNTER — Encounter (HOSPITAL_BASED_OUTPATIENT_CLINIC_OR_DEPARTMENT_OTHER): Payer: Self-pay | Admitting: Urology

## 2021-07-19 ENCOUNTER — Other Ambulatory Visit: Payer: Self-pay

## 2021-07-19 ENCOUNTER — Encounter (HOSPITAL_COMMUNITY): Payer: Self-pay

## 2021-07-19 ENCOUNTER — Emergency Department (HOSPITAL_COMMUNITY): Payer: Medicaid Other

## 2021-07-19 ENCOUNTER — Inpatient Hospital Stay (HOSPITAL_COMMUNITY)
Admission: EM | Admit: 2021-07-19 | Discharge: 2021-07-21 | DRG: 872 | Disposition: A | Discharge status: Home / Self Care | Payer: Medicaid Other | Attending: Internal Medicine | Admitting: Internal Medicine

## 2021-07-19 DIAGNOSIS — A4151 Sepsis due to Escherichia coli [E. coli]: Principal | ICD-10-CM | POA: Diagnosis present

## 2021-07-19 DIAGNOSIS — Z975 Presence of (intrauterine) contraceptive device: Secondary | ICD-10-CM | POA: Diagnosis not present

## 2021-07-19 DIAGNOSIS — Z20822 Contact with and (suspected) exposure to covid-19: Secondary | ICD-10-CM | POA: Diagnosis present

## 2021-07-19 DIAGNOSIS — N179 Acute kidney failure, unspecified: Secondary | ICD-10-CM | POA: Diagnosis present

## 2021-07-19 DIAGNOSIS — R6520 Severe sepsis without septic shock: Secondary | ICD-10-CM

## 2021-07-19 DIAGNOSIS — K219 Gastro-esophageal reflux disease without esophagitis: Secondary | ICD-10-CM | POA: Diagnosis present

## 2021-07-19 DIAGNOSIS — Z793 Long term (current) use of hormonal contraceptives: Secondary | ICD-10-CM | POA: Diagnosis not present

## 2021-07-19 DIAGNOSIS — Z79899 Other long term (current) drug therapy: Secondary | ICD-10-CM

## 2021-07-19 DIAGNOSIS — N390 Urinary tract infection, site not specified: Secondary | ICD-10-CM | POA: Diagnosis present

## 2021-07-19 DIAGNOSIS — N136 Pyonephrosis: Secondary | ICD-10-CM | POA: Diagnosis present

## 2021-07-19 DIAGNOSIS — R7989 Other specified abnormal findings of blood chemistry: Secondary | ICD-10-CM | POA: Diagnosis present

## 2021-07-19 DIAGNOSIS — E872 Acidosis, unspecified: Secondary | ICD-10-CM

## 2021-07-19 DIAGNOSIS — E876 Hypokalemia: Secondary | ICD-10-CM | POA: Diagnosis present

## 2021-07-19 DIAGNOSIS — Z87442 Personal history of urinary calculi: Secondary | ICD-10-CM

## 2021-07-19 DIAGNOSIS — R109 Unspecified abdominal pain: Secondary | ICD-10-CM | POA: Diagnosis not present

## 2021-07-19 DIAGNOSIS — A419 Sepsis, unspecified organism: Secondary | ICD-10-CM

## 2021-07-19 DIAGNOSIS — R112 Nausea with vomiting, unspecified: Secondary | ICD-10-CM | POA: Diagnosis present

## 2021-07-19 DIAGNOSIS — N200 Calculus of kidney: Secondary | ICD-10-CM

## 2021-07-19 LAB — RESP PANEL BY RT-PCR (FLU A&B, COVID) ARPGX2
Influenza A by PCR: NEGATIVE
Influenza B by PCR: NEGATIVE
SARS Coronavirus 2 by RT PCR: NEGATIVE

## 2021-07-19 LAB — COMPREHENSIVE METABOLIC PANEL
ALT: 31 U/L (ref 0–44)
AST: 59 U/L — ABNORMAL HIGH (ref 15–41)
Albumin: 4.3 g/dL (ref 3.5–5.0)
Alkaline Phosphatase: 144 U/L — ABNORMAL HIGH (ref 38–126)
Anion gap: 13 (ref 5–15)
BUN: 18 mg/dL (ref 6–20)
CO2: 22 mmol/L (ref 22–32)
Calcium: 9 mg/dL (ref 8.9–10.3)
Chloride: 102 mmol/L (ref 98–111)
Creatinine, Ser: 1.92 mg/dL — ABNORMAL HIGH (ref 0.44–1.00)
GFR, Estimated: 33 mL/min — ABNORMAL LOW (ref >60)
Glucose, Bld: 96 mg/dL (ref 70–99)
Potassium: 3 mmol/L — ABNORMAL LOW (ref 3.5–5.1)
Sodium: 137 mmol/L (ref 135–145)
Total Bilirubin: 1.2 mg/dL (ref 0.3–1.2)
Total Protein: 7.3 g/dL (ref 6.5–8.1)

## 2021-07-19 LAB — CBC WITH DIFFERENTIAL/PLATELET
Abs Immature Granulocytes: 0.15 10*3/uL — ABNORMAL HIGH (ref 0.00–0.07)
Basophils Absolute: 0.1 10*3/uL (ref 0.0–0.1)
Basophils Relative: 1 %
Eosinophils Absolute: 0 10*3/uL (ref 0.0–0.5)
Eosinophils Relative: 0 %
HCT: 46.4 % — ABNORMAL HIGH (ref 36.0–46.0)
Hemoglobin: 15.8 g/dL — ABNORMAL HIGH (ref 12.0–15.0)
Immature Granulocytes: 1 %
Lymphocytes Relative: 4 %
Lymphs Abs: 0.4 10*3/uL — ABNORMAL LOW (ref 0.7–4.0)
MCH: 32 pg (ref 26.0–34.0)
MCHC: 34.1 g/dL (ref 30.0–36.0)
MCV: 94.1 fL (ref 80.0–100.0)
Monocytes Absolute: 0 10*3/uL — ABNORMAL LOW (ref 0.1–1.0)
Monocytes Relative: 0 %
Neutro Abs: 9.9 10*3/uL — ABNORMAL HIGH (ref 1.7–7.7)
Neutrophils Relative %: 94 %
Platelets: 311 10*3/uL (ref 150–400)
RBC: 4.93 MIL/uL (ref 3.87–5.11)
RDW: 12 % (ref 11.5–15.5)
WBC: 10.6 10*3/uL — ABNORMAL HIGH (ref 4.0–10.5)
nRBC: 0 % (ref 0.0–0.2)

## 2021-07-19 LAB — LACTIC ACID, PLASMA
Lactic Acid, Venous: 2.8 mmol/L (ref 0.5–1.9)
Lactic Acid, Venous: 3.8 mmol/L (ref 0.5–1.9)
Lactic Acid, Venous: 3.4 mmol/L (ref 0.5–1.9)

## 2021-07-19 LAB — URINALYSIS, MICROSCOPIC (REFLEX)

## 2021-07-19 LAB — PHOSPHORUS: Phosphorus: 2.5 mg/dL (ref 2.5–4.6)

## 2021-07-19 LAB — URINALYSIS, ROUTINE W REFLEX MICROSCOPIC
Bilirubin Urine: NEGATIVE
Glucose, UA: NEGATIVE mg/dL
Ketones, ur: 15 mg/dL — AB
Nitrite: NEGATIVE
Protein, ur: 100 mg/dL — AB
Specific Gravity, Urine: 1.025 (ref 1.005–1.030)
pH: 6 (ref 5.0–8.0)

## 2021-07-19 LAB — HIV ANTIBODY (ROUTINE TESTING W REFLEX): HIV Screen 4th Generation wRfx: NONREACTIVE

## 2021-07-19 LAB — MAGNESIUM: Magnesium: 1.4 mg/dL — ABNORMAL LOW (ref 1.7–2.4)

## 2021-07-19 LAB — POC URINE PREG, ED: Preg Test, Ur: NEGATIVE

## 2021-07-19 LAB — LIPASE, BLOOD: Lipase: 34 U/L (ref 11–51)

## 2021-07-19 MED ORDER — SODIUM CHLORIDE 0.9 % IV SOLN
1.0000 g | INTRAVENOUS | Status: DC
  Administered 2021-07-20 – 2021-07-21 (×2): 1 g via INTRAVENOUS
  Filled 2021-07-19 (×2): qty 10

## 2021-07-19 MED ORDER — ONDANSETRON HCL 4 MG/2ML IJ SOLN
4.0000 mg | Freq: Once | INTRAMUSCULAR | Status: AC
  Administered 2021-07-19: 4 mg via INTRAVENOUS
  Filled 2021-07-19: qty 2

## 2021-07-19 MED ORDER — HYDROMORPHONE HCL 1 MG/ML IJ SOLN
0.5000 mg | Freq: Once | INTRAMUSCULAR | Status: AC
  Administered 2021-07-19: 0.5 mg via INTRAVENOUS
  Filled 2021-07-19: qty 1

## 2021-07-19 MED ORDER — ACETAMINOPHEN 325 MG PO TABS
650.0000 mg | ORAL_TABLET | Freq: Four times a day (QID) | ORAL | Status: DC | PRN
  Administered 2021-07-19 – 2021-07-20 (×2): 650 mg via ORAL
  Filled 2021-07-19 (×2): qty 2

## 2021-07-19 MED ORDER — ACETAMINOPHEN 650 MG RE SUPP: 650.0000 mg | Freq: Four times a day (QID) | RECTAL | Status: DC | PRN

## 2021-07-19 MED ORDER — SODIUM CHLORIDE 0.9 % IV BOLUS
1000.0000 mL | Freq: Once | INTRAVENOUS | Status: AC
  Administered 2021-07-19: 1000 mL via INTRAVENOUS

## 2021-07-19 MED ORDER — PANTOPRAZOLE SODIUM 40 MG PO TBEC
40.0000 mg | DELAYED_RELEASE_TABLET | Freq: Every day | ORAL | Status: DC
  Administered 2021-07-19 – 2021-07-21 (×3): 40 mg via ORAL
  Filled 2021-07-19 (×3): qty 1

## 2021-07-19 MED ORDER — POTASSIUM CHLORIDE IN NACL 40-0.9 MEQ/L-% IV SOLN
INTRAVENOUS | Status: DC
  Filled 2021-07-19 (×6): qty 1000

## 2021-07-19 MED ORDER — SODIUM CHLORIDE 0.9 % IV BOLUS
500.0000 mL | Freq: Once | INTRAVENOUS | Status: AC
  Administered 2021-07-19: 500 mL via INTRAVENOUS

## 2021-07-19 MED ORDER — MORPHINE SULFATE (PF) 2 MG/ML IV SOLN
2.0000 mg | INTRAVENOUS | Status: DC | PRN
  Administered 2021-07-20 – 2021-07-21 (×2): 2 mg via INTRAVENOUS
  Filled 2021-07-19 (×2): qty 1

## 2021-07-19 MED ORDER — ENOXAPARIN SODIUM 40 MG/0.4ML IJ SOSY
40.0000 mg | PREFILLED_SYRINGE | INTRAMUSCULAR | Status: DC
  Administered 2021-07-19 – 2021-07-21 (×3): 40 mg via SUBCUTANEOUS
  Filled 2021-07-19 (×3): qty 0.4

## 2021-07-19 MED ORDER — MAGNESIUM SULFATE 2 GM/50ML IV SOLN
2.0000 g | Freq: Once | INTRAVENOUS | Status: AC
  Administered 2021-07-19: 2 g via INTRAVENOUS
  Filled 2021-07-19: qty 50

## 2021-07-19 MED ORDER — SODIUM CHLORIDE 0.9 % IV SOLN
2.0000 g | Freq: Once | INTRAVENOUS | Status: AC
  Administered 2021-07-19: 2 g via INTRAVENOUS
  Filled 2021-07-19: qty 20

## 2021-07-19 NOTE — ED Notes (Signed)
Pt reports she was drinking Tequila earlier this evening, but only had "2 shots."

## 2021-07-19 NOTE — Progress Notes (Signed)
Date and time results received: 07/19/21 1857 (use smartphrase ".now" to insert current time)  Test: Lactic Acid Critical Value: 3.8  Name of Provider Notified: Zierle-Gosh  Orders Received? Or Actions Taken?: awaiting response

## 2021-07-19 NOTE — ED Triage Notes (Signed)
Pt reports left upper and lower abd pain that started a couple of hours, pt says she has vomited numerous times. Pt BP is low in triage.

## 2021-07-19 NOTE — ED Notes (Signed)
Date and time results received: 07/19/21 7:35 AM    Test: lactic Acid Critical Value: 2.8  Name of Provider Notified: Dr. Blinda Leatherwood  Orders Received? Or Actions Taken?: see orders

## 2021-07-19 NOTE — H&P (Signed)
History and Physical    BREIANNA DELFINO GYK:[id number] DOB: 13-Sep-1980 DOA: 07/19/2021  PCP: Patient, No Pcp Per (Inactive)  Patient coming from: home  I have personally briefly reviewed patient's old medical records in [facility]  Chief Complaint: flank pain, nausea and vomiting.  HPI: Kirsten West is a 40 y.o. female with medical history significant of history of nephrolithiasis and gastroesophageal reflux disease; who presented to the hospital secondary to left flank pain radiating to her groin, increased frequency, nausea and vomiting.  Symptom has been present for the last 2 days and worsening.  Patient reports no fever.  She also denies chest pain, shortness of breath, hematuria, melena, hematochezia, focal weakness, sick contacts or any other complaints. Patient is now vaccinated against COVID; COVID PCR in the emergency department negative.  ED Course: Mild tachycardia, acute kidney injury with creatinine up to 1.9; potassium 3.0, WBCs 10.6, patient complaining of flank pain requiring IV analgesics.  Urinalysis suggesting UTI; lactic acid was checked and elevated at 2.4.  Cultures (urine and blood) taken; empirically antibiotics using Rocephin initiated and IV fluids provided.  TRH has been called to place in the hospital for further evaluation and management.  Review of Systems: As per HPI otherwise all other systems reviewed and are negative.    Past Medical History:  Diagnosis Date   GERD (gastroesophageal reflux disease)    occaional, no meds   History of kidney stones 2014   treated at [facility]   Left ureteral stone     Past Surgical History:  Procedure Laterality Date   CYSTOSCOPY W/ URETERAL STENT PLACEMENT Left 05/10/2018   Procedure: CYSTOSCOPY WITH RETROGRADE PYELOGRAM/URETERAL STENT PLACEMENT;  Surgeon: Crist Fat, MD;  Location: [facility];  Service: Urology;  Laterality: Left;   CYSTOSCOPY WITH URETEROSCOPY AND STENT PLACEMENT  2015    CYSTOSCOPY/URETEROSCOPY/HOLMIUM LASER/STENT PLACEMENT Left 05/26/2018   Procedure: CYSTOSCOPY/LEFT URETEROSCOPY/HOLMIUM LASER/STENT EXCHANGE;  Surgeon: Crist Fat, MD;  Location: [facility];  Service: Urology;  Laterality: Left;   EXTRACORPOREAL SHOCK WAVE LITHOTRIPSY  2015    Social History  reports that she has never smoked. She has never used smokeless tobacco. She reports that she does not drink alcohol and does not use drugs.  No Known Allergies  Family history: Patient reports no important medical problems in her family  Prior to Admission medications   Medication Sig Start Date End Date Taking? Authorizing Provider  acetaminophen (TYLENOL) 500 MG tablet Take 1,000 mg by mouth every 6 (six) hours as needed.    [provider]  levonorgestrel (MIRENA) 20 MCG/24HR IUD 1 each by Intrauterine route once.    [provider]  Multiple Vitamin (MULTIVITAMIN) tablet Take 1 tablet by mouth daily.    [provider]  phenazopyridine (PYRIDIUM) 200 MG tablet Take 1 tablet (200 mg total) by mouth 3 (three) times daily as needed for pain. 05/26/18   Crist Fat, MD    Physical Exam: Vitals:   07/19/21 0300 07/19/21 0400 07/19/21 0500 07/19/21 0745  BP: 95/70 92/79 96/70  91/77  Pulse: (!) 115 98 (!) 101 99  Resp: 18 18 18 18   Temp:      TempSrc:      SpO2: 95% 93% 95% 100%  Weight:      Height:        Constitutional: No chest pain, afebrile; reports mild nausea but no vomiting. Vitals:   07/19/21 0300 07/19/21 0400 07/19/21 0500 07/19/21 0745  BP: 95/70  92/79 96/70 91/77   Pulse: (!) 115 98 (!) 101 99  Resp: 18 18 18 18   Temp:      TempSrc:      SpO2: 95% 93% 95% 100%  Weight:      Height:       Eyes: PERRL, lids and conjunctivae normal ENMT: Mucous membranes are dry on exam. Posterior pharynx clear of any exudate or lesions. Fair dentition Neck: normal, supple, no masses, no thyromegaly; no JVD. Respiratory: clear to  auscultation bilaterally, no wheezing, no crackles. Normal respiratory effort. No accessory muscle use.  Cardiovascular: Regular rate and rhythm, no murmurs / rubs / gallops. No extremity edema. 2+ pedal pulses. No carotid bruits.  Abdomen: Soft, no guarding, vague discomfort in her suprapubic area and left flank.  Positive bowel sounds. Musculoskeletal: no clubbing / cyanosis. No joint deformity upper and lower extremities. Good ROM, no contractures. Normal muscle tone.  Skin: no rashes, lesions, ulcers. No induration Neurologic: CN 2-12 grossly intact. Sensation intact, DTR normal. Strength 5/5 in all 4.  Psychiatric: Normal judgment and insight. Alert and oriented x 3. Normal mood.   Labs on Admission: I have personally reviewed following labs and imaging studies  CBC: Recent Labs  Lab 07/19/21 0206  WBC 10.6*  NEUTROABS 9.9*  HGB 15.8*  HCT 46.4*  MCV 94.1  PLT [id number]    Basic Metabolic Panel: Recent Labs  Lab 07/19/21 0206  NA 137  K 3.0*  CL 102  CO2 22  GLUCOSE 96  BUN 18  CREATININE 1.92*  CALCIUM 9.0    GFR: Estimated Creatinine Clearance: 31.9 mL/min (A) (by C-G formula based on SCr of 1.92 mg/dL (H)).  Liver Function Tests: Recent Labs  Lab 07/19/21 0206  AST 59*  ALT 31  ALKPHOS 144*  BILITOT 1.2  PROT 7.3  ALBUMIN 4.3    Urine analysis:    Component Value Date/Time   COLORURINE YELLOW 07/19/2021 0333   APPEARANCEUR CLOUDY (A) 07/19/2021 0333   LABSPEC 1.025 07/19/2021 0333   PHURINE 6.0 07/19/2021 0333   GLUCOSEU NEGATIVE 07/19/2021 0333   HGBUR MODERATE (A) 07/19/2021 0333   BILIRUBINUR NEGATIVE 07/19/2021 0333   KETONESUR 15 (A) 07/19/2021 0333   PROTEINUR 100 (A) 07/19/2021 0333   NITRITE NEGATIVE 07/19/2021 0333   LEUKOCYTESUR MODERATE (A) 07/19/2021 0333    Radiological Exams on Admission: CT RENAL STONE STUDY  Result Date: 07/19/2021 CLINICAL DATA:  Acute left-sided abdominal pain. EXAM: CT ABDOMEN AND PELVIS WITHOUT CONTRAST  TECHNIQUE: Multidetector CT imaging of the abdomen and pelvis was performed following the standard protocol without IV contrast. COMPARISON:  None available currently. FINDINGS: Lower chest: No acute abnormality. Hepatobiliary: No gallstones or biliary dilatation is noted. Small calcified granulomas are noted throughout hepatic parenchyma. Pancreas: Unremarkable. No pancreatic ductal dilatation or surrounding inflammatory changes. Spleen: Normal in size without focal abnormality. Adrenals/Urinary Tract: Adrenal glands appear normal. Bilateral nephrolithiasis is noted, including 8 mm calculus in lower pole collecting system of left kidney. Mild left hydroureteronephrosis with perinephric stranding is noted without obstructing calculus. Urinary bladder is decompressed. Stomach/Bowel: Stomach is within normal limits. Appendix appears normal. No evidence of bowel wall thickening, distention, or inflammatory changes. Vascular/Lymphatic: No significant vascular findings are present. No enlarged abdominal or pelvic lymph nodes. Reproductive: Intrauterine device is noted. No adnexal abnormality is noted. Other: No abdominal wall hernia or abnormality. No abdominopelvic ascites. Musculoskeletal: No acute or significant osseous findings. IMPRESSION: Bilateral nephrolithiasis. Mild left hydroureteronephrosis with perinephric stranding is noted without evidence of  obstructing calculus. Electronically Signed   By: Marijo Conception M.D.   On: 07/19/2021 06:26    EKG: None  Assessment/Plan 1-SIRS/early sepsis UTI (urinary tract infection) -Patient meeting early sepsis criteria with urinalysis suggesting source of infection, tachycardia, mild elevation of her WBCs and acute kidney injury. -Lactic acid elevated at 2.4 -Fluid resuscitation has been initiated in the emergency department following sepsis protocol. -Cultures taken and will follow results -Empirically started on IV Rocephin. -As needed analgesics and  antiemetics will be provided -Patient is afebrile. -Follow clinical response. -Initially allowing only clear liquid diets; once tolerated diet will be advanced.  2-AKI (acute kidney injury) ([place]) -In the setting of UTI -Mild left hydronephrosis and hydroureter appreciated without obstructing stones. -Continue fluid resuscitation and IV antibiotics as mentioned above -Avoid nephrotoxic agents -Follow renal function trend.  3-Hypokalemia -In the setting of GI losses and decreased oral intake -Will provide repletion and follow electrolytes trend -Checking magnesium level.  4-Nausea & vomiting -Secondary to UTI -Will provide as needed antiemetics. -Maintain adequate hydration.  5-Nephrolithiasis -Nonobstructive -Prior history of it reported -Outpatient follow-up with urology will be recommended.  6-gastroesophageal reflux disease -Continue PPI.  DVT prophylaxis: Lovenox Code Status:   Full code Family Communication:  No family at bedside Disposition Plan:   Patient is from:  Home  Anticipated DC to:  Home  Anticipated DC date:  07/21/21  Anticipated DC barriers: Ability to tolerate p.o.'s and resolution of acute kidney injury.  Consults called:  None Admission status:  Inpatient, length of stay more than 2 midnights; MedSurg bed.  Severity of Illness: The appropriate patient status for this patient is INPATIENT. Inpatient status is judged to be reasonable and necessary in order to provide the required intensity of service to ensure the patient's safety. The patient's presenting symptoms, physical exam findings, and initial radiographic and laboratory data in the context of their chronic comorbidities is felt to place them at high risk for further clinical deterioration. Furthermore, it is not anticipated that the patient will be medically stable for discharge from the hospital within 2 midnights of admission.   * I certify that at the point of admission it is my clinical  judgment that the patient will require inpatient hospital care spanning beyond 2 midnights from the point of admission due to high intensity of service, high risk for further deterioration and high frequency of surveillance required.Barton Dubois MD [facility]  How to contact the [facility] Attending or Consulting provider [place] or covering provider during after hours [place], for this patient?   Check the care team in [facility] and look for a) attending/consulting [facility] provider listed and b) the [facility] team listed Log into [email or web address] and use [facility] universal password to access. If you do not have the password, please contact the hospital operator. Locate the [facility] provider you are looking for under [facility] and page to a number that you can be directly reached. If you still have difficulty reaching the provider, please page the [facility] (Director on Call) for the Hospitalists listed on amion for assistance.  07/19/2021, 8:14 AM

## 2021-07-19 NOTE — ED Provider Notes (Signed)
[facility] EMERGENCY DEPARTMENT Provider Note   CSN: [id number] Arrival date & time: 07/19/21  0101     History Chief Complaint  Patient presents with   Abdominal Pain    Kirsten West is a 40 y.o. female.  Patient presents to the emergency department for evaluation of abdominal pain.  Patient reports severe left lower abdominal and groin area pain.  Pain started a couple of hours ago, is continuous.  She has associated nausea and vomiting.  Patient does report a history of kidney stones, is not sure if this feels similar.  She did have some back pain initially, but pain is now focal to the left pelvis/groin.      Past Medical History:  Diagnosis Date   GERD (gastroesophageal reflux disease)    occaional, no meds   History of kidney stones 2014   treated at [facility]   Left ureteral stone     There are no problems to display for this patient.   Past Surgical History:  Procedure Laterality Date   CYSTOSCOPY W/ URETERAL STENT PLACEMENT Left 05/10/2018   Procedure: CYSTOSCOPY WITH RETROGRADE PYELOGRAM/URETERAL STENT PLACEMENT;  Surgeon: Crist Fat, MD;  Location: [facility];  Service: Urology;  Laterality: Left;   CYSTOSCOPY WITH URETEROSCOPY AND STENT PLACEMENT  2015   CYSTOSCOPY/URETEROSCOPY/HOLMIUM LASER/STENT PLACEMENT Left 05/26/2018   Procedure: CYSTOSCOPY/LEFT URETEROSCOPY/HOLMIUM LASER/STENT EXCHANGE;  Surgeon: Crist Fat, MD;  Location: [facility];  Service: Urology;  Laterality: Left;   EXTRACORPOREAL SHOCK WAVE LITHOTRIPSY  2015     OB History   No obstetric history on file.     No family history on file.  Social History   Tobacco Use   Smoking status: Never   Smokeless tobacco: Never  Vaping Use   Vaping Use: Never used  Substance Use Topics   Alcohol use: Never   Drug use: Never    Home Medications Prior to Admission medications   Medication Sig Start Date End Date Taking? Authorizing Provider   acetaminophen (TYLENOL) 500 MG tablet Take 1,000 mg by mouth every 6 (six) hours as needed.    [provider]  levonorgestrel (MIRENA) 20 MCG/24HR IUD 1 each by Intrauterine route once.    [provider]  Multiple Vitamin (MULTIVITAMIN) tablet Take 1 tablet by mouth daily.    [provider]  phenazopyridine (PYRIDIUM) 200 MG tablet Take 1 tablet (200 mg total) by mouth 3 (three) times daily as needed for pain. 05/26/18   Crist Fat, MD    Allergies    Patient has no known allergies.  Review of Systems   Review of Systems  Gastrointestinal:  Positive for nausea and vomiting.  Genitourinary:  Positive for flank pain.  All other systems reviewed and are negative.  Physical Exam Updated Vital Signs BP 91/77   Pulse 99   Temp 97.8 F (36.6 C) (Oral)   Resp 18   Ht 5\' 3"  (1.6 m)   Wt 53.5 kg   SpO2 100%   BMI 20.90 kg/m   Physical Exam Vitals and nursing note reviewed.  Constitutional:      General: She is not in acute distress.    Appearance: Normal appearance. She is well-developed.  HENT:     Head: Normocephalic and atraumatic.     Right Ear: Hearing normal.     Left Ear: Hearing normal.     Nose: Nose normal.  Eyes:     Conjunctiva/sclera: Conjunctivae normal.  Pupils: Pupils are equal, round, and reactive to light.  Cardiovascular:     Rate and Rhythm: Regular rhythm.     Heart sounds: S1 normal and S2 normal. No murmur heard.   No friction rub. No gallop.  Pulmonary:     Effort: Pulmonary effort is normal. No respiratory distress.     Breath sounds: Normal breath sounds.  Chest:     Chest wall: No tenderness.  Abdominal:     General: Bowel sounds are normal.     Palpations: Abdomen is soft.     Tenderness: There is no abdominal tenderness. There is no right CVA tenderness, left CVA tenderness, guarding or rebound. Negative signs include Murphy's sign and McBurney's sign.     Hernia: No hernia is present.   Musculoskeletal:        General: Normal range of motion.     Cervical back: Normal range of motion and neck supple.  Skin:    General: Skin is warm and dry.     Findings: No rash.  Neurological:     Mental Status: She is alert and oriented to person, place, and time.     GCS: GCS eye subscore is 4. GCS verbal subscore is 5. GCS motor subscore is 6.     Cranial Nerves: No cranial nerve deficit.     Sensory: No sensory deficit.     Coordination: Coordination normal.  Psychiatric:        Speech: Speech normal.        Behavior: Behavior normal.        Thought Content: Thought content normal.    ED Results / Procedures / Treatments   Labs (all labs ordered are listed, but only abnormal results are displayed) Labs Reviewed  CBC WITH DIFFERENTIAL/PLATELET - Abnormal; Notable for the following components:      Result Value   WBC 10.6 (*)    Hemoglobin 15.8 (*)    HCT 46.4 (*)    Neutro Abs 9.9 (*)    Lymphs Abs 0.4 (*)    Monocytes Absolute 0.0 (*)    Abs Immature Granulocytes 0.15 (*)    All other components within normal limits  COMPREHENSIVE METABOLIC PANEL - Abnormal; Notable for the following components:   Potassium 3.0 (*)    Creatinine, Ser 1.92 (*)    AST 59 (*)    Alkaline Phosphatase 144 (*)    GFR, Estimated 33 (*)    All other components within normal limits  URINALYSIS, ROUTINE W REFLEX MICROSCOPIC - Abnormal; Notable for the following components:   APPearance CLOUDY (*)    Hgb urine dipstick MODERATE (*)    Ketones, ur 15 (*)    Protein, ur 100 (*)    Leukocytes,Ua MODERATE (*)    All other components within normal limits  URINALYSIS, MICROSCOPIC (REFLEX) - Abnormal; Notable for the following components:   Bacteria, UA MANY (*)    All other components within normal limits  LACTIC ACID, PLASMA - Abnormal; Notable for the following components:   Lactic Acid, Venous 2.8 (*)    All other components within normal limits  URINE CULTURE  CULTURE, BLOOD (SINGLE)   RESP PANEL BY RT-PCR (FLU A&B, COVID) [id number]  LIPASE, BLOOD  POC URINE PREG, ED    EKG None  Radiology CT RENAL STONE STUDY  Result Date: 07/19/2021 CLINICAL DATA:  Acute left-sided abdominal pain. EXAM: CT ABDOMEN AND PELVIS WITHOUT CONTRAST TECHNIQUE: Multidetector CT imaging of the abdomen and pelvis was performed following the  standard protocol without IV contrast. COMPARISON:  None available currently. FINDINGS: Lower chest: No acute abnormality. Hepatobiliary: No gallstones or biliary dilatation is noted. Small calcified granulomas are noted throughout hepatic parenchyma. Pancreas: Unremarkable. No pancreatic ductal dilatation or surrounding inflammatory changes. Spleen: Normal in size without focal abnormality. Adrenals/Urinary Tract: Adrenal glands appear normal. Bilateral nephrolithiasis is noted, including 8 mm calculus in lower pole collecting system of left kidney. Mild left hydroureteronephrosis with perinephric stranding is noted without obstructing calculus. Urinary bladder is decompressed. Stomach/Bowel: Stomach is within normal limits. Appendix appears normal. No evidence of bowel wall thickening, distention, or inflammatory changes. Vascular/Lymphatic: No significant vascular findings are present. No enlarged abdominal or pelvic lymph nodes. Reproductive: Intrauterine device is noted. No adnexal abnormality is noted. Other: No abdominal wall hernia or abnormality. No abdominopelvic ascites. Musculoskeletal: No acute or significant osseous findings. IMPRESSION: Bilateral nephrolithiasis. Mild left hydroureteronephrosis with perinephric stranding is noted without evidence of obstructing calculus. Electronically Signed   By: Marijo Conception M.D.   On: 07/19/2021 06:26    Procedures Procedures   Medications Ordered in ED Medications  ondansetron [facility]) injection 4 mg (4 mg Intravenous Given 07/19/21 0227)  HYDROmorphone (DILAUDID) injection 0.5 mg (0.5 mg Intravenous Given  07/19/21 0227)  cefTRIAXone (ROCEPHIN) 2 g in sodium chloride 0.9 % 100 mL IVPB (0 g Intravenous Stopped 07/19/21 0744)  sodium chloride 0.9 % bolus 1,000 mL (1,000 mLs Intravenous Bolus 07/19/21 [id number])    ED Course  I have reviewed the triage vital signs and the nursing notes.  Pertinent labs & imaging results that were available during my care of the patient were reviewed by me and considered in my medical decision making (see chart for details).    MDM Rules/Calculators/A&P                           Patient presents to the emergency department for evaluation of left flank pain.  Pain was initially in the back, now in the lower portion of her abdomen.  Patient reports a history of kidney stones.  Initially kidney stones were thought to be the likely etiology of her pain.  Urinalysis, however, did show more white blood cells and red cells, concerning for possible infection.  CT scan shows hydronephrosis and hydroureter without a stone.  She has had some episodes of mild hypotension.  Based on this a lactic acid was added on and is mildly elevated at 2.8.  We will aggressively fluid resuscitate.  Rocephin 2 g IV.  Admit for further management.  Final Clinical Impression(s) / ED Diagnoses Final diagnoses:  Urinary tract infection without hematuria, site unspecified    Rx / DC Orders ED Discharge Orders     None        Luigi Stuckey, Gwenyth Allegra, MD 07/19/21 [phone]

## 2021-07-20 LAB — BASIC METABOLIC PANEL
Anion gap: 4 — ABNORMAL LOW (ref 5–15)
BUN: 18 mg/dL (ref 6–20)
CO2: 20 mmol/L — ABNORMAL LOW (ref 22–32)
Calcium: 6.8 mg/dL — ABNORMAL LOW (ref 8.9–10.3)
Chloride: 114 mmol/L — ABNORMAL HIGH (ref 98–111)
Creatinine, Ser: 0.92 mg/dL (ref 0.44–1.00)
GFR, Estimated: >60 mL/min (ref >60)
Glucose, Bld: 87 mg/dL (ref 70–99)
Potassium: 4.9 mmol/L (ref 3.5–5.1)
Sodium: 138 mmol/L (ref 135–145)

## 2021-07-20 LAB — CBC
HCT: 32.1 % — ABNORMAL LOW (ref 36.0–46.0)
Hemoglobin: 10.5 g/dL — ABNORMAL LOW (ref 12.0–15.0)
MCH: 30.7 pg (ref 26.0–34.0)
MCHC: 32.7 g/dL (ref 30.0–36.0)
MCV: 93.9 fL (ref 80.0–100.0)
Platelets: 170 10*3/uL (ref 150–400)
RBC: 3.42 MIL/uL — ABNORMAL LOW (ref 3.87–5.11)
RDW: 12.7 % (ref 11.5–15.5)
WBC: 37 10*3/uL — ABNORMAL HIGH (ref 4.0–10.5)
nRBC: 0 % (ref 0.0–0.2)

## 2021-07-20 LAB — MAGNESIUM: Magnesium: 2.2 mg/dL (ref 1.7–2.4)

## 2021-07-20 LAB — LACTIC ACID, PLASMA: Lactic Acid, Venous: 1.7 mmol/L (ref 0.5–1.9)

## 2021-07-20 MED ORDER — ONDANSETRON HCL 4 MG/2ML IJ SOLN
4.0000 mg | Freq: Once | INTRAMUSCULAR | Status: AC
  Administered 2021-07-20: 4 mg via INTRAVENOUS
  Filled 2021-07-20: qty 2

## 2021-07-20 NOTE — Progress Notes (Signed)
PROGRESS NOTE    Kirsten West  BOF:[id number] DOB: 1980-12-31 DOA: 07/19/2021 PCP: Patient, No Pcp Per (Inactive)   Chief Complaint  Patient presents with   Abdominal Pain    Brief Narrative:  Kirsten West is a 40 y.o. female with medical history significant of history of nephrolithiasis and gastroesophageal reflux disease; who presented to the hospital secondary to left flank pain radiating to her groin, increased frequency, nausea and vomiting.  Symptom has been present for the last 2 days and worsening.  Patient reports no fever.  She also denies chest pain, shortness of breath, hematuria, melena, hematochezia, focal weakness, sick contacts or any other complaints. Patient is now vaccinated against COVID; COVID PCR in the emergency department negative.   ED Course: Mild tachycardia, acute kidney injury with creatinine up to 1.9; potassium 3.0, WBCs 10.6, patient complaining of flank pain requiring IV analgesics.  Urinalysis suggesting UTI; lactic acid was checked and elevated at 2.4.  Cultures (urine and blood) taken; empirically antibiotics using Rocephin initiated and IV fluids provided.  TRH has been called to place in the hospital for further evaluation and management.   Assessment & Plan: 1-SIRS/early sepsis due to UTI (urinary tract infection) -criteria met on admission. -sepsis features improving  -lactic acid now resolved -follow culture results -continue IV antibiotics -follow clinical response -continue antiemetics and analgesics.  2-leukocytosis -probably not accurate, given that patient overall clinical features are improving -will continue IVF's -repeat labs in am -patient is afebrile  3-AKI -due to #1 and pre-renal azotemia -resolved after IVF's -continue treatment of UTI  4-hypokalemia/hypomagnesemia -repleted and WNL currently -will continue to follow electrolytes stability  5-nausea/vomiting -continue PRN antiemetics. -no further vomiting  reported -patient still having intermittent nausea.  6-GERD -continue PPI  7-nephrolithiasis -non-obstructive -outpatient follow up with urology recommended.   DVT prophylaxis: lovenox. Code Status: Full Code.  Family Communication: no family at bedside. Disposition:   Status is: Inpatient    Consultants:  None   Procedures:  See below for x-ray reports.  Antimicrobials:  Rocephin    Subjective: Afebrile, no CP, no vomiting, no fever. Reports having some nausea earlier and expressed improvement in her flank pain.   Objective: Vitals:   07/19/21 1552 07/19/21 2138 07/20/21 0435 07/20/21 1342  BP: (!) 86/53 (!) 94/57 101/69 106/72  Pulse: 99 97 90 72  Resp: 18 20 16    Temp: 98 F (36.7 C) 98.2 F (36.8 C) 98.2 F (36.8 C) 97.7 F (36.5 C)  TempSrc: Oral Oral  Oral  SpO2: 100% 100% 98% 98%  Weight:      Height:        Intake/Output Summary (Last 24 hours) at 07/20/2021 1549 Last data filed at 07/20/2021 0929 Gross per 24 hour  Intake 2141.47 ml  Output --  Net 2141.47 ml   Filed Weights   07/19/21 0149 07/19/21 1115  Weight: 53.5 kg 56.2 kg    Examination:  General exam: Appears calm and comfortable; no fever. Reporting mild nausea, but not further vomiting. Patient expressed improvement in her flank pain.  Respiratory system: Clear to auscultation. Respiratory effort normal. Good sat on RA, no using accessory muscles.  Cardiovascular system: S1 & S2 heard, RRR. No JVD, murmurs, rubs, gallops or clicks. No pedal edema. Gastrointestinal system: Abdomen is nondistended, soft and minimally tender on deep palpation (left flank area). No organomegaly or masses felt. Normal bowel sounds heard. Central nervous system: Alert and oriented. No focal neurological deficits. Extremities: Symmetric 5 x 5  power. No cyanosis, no clubbing.  Skin: No rashes, lesions or ulcers Psychiatry: Judgement and insight appear normal. Mood & affect appropriate.     Data  Reviewed: I have personally reviewed following labs and imaging studies  CBC: Recent Labs  Lab 07/19/21 0206 07/20/21 0603  WBC 10.6* 37.0*  NEUTROABS 9.9*  --   HGB 15.8* 10.5*  HCT 46.4* 32.1*  MCV 94.1 93.9  PLT 311 096    Basic Metabolic Panel: Recent Labs  Lab 07/19/21 0206 07/19/21 0757 07/20/21 0603 07/20/21 0843  NA 137  --  138  --   K 3.0*  --  4.9  --   CL 102  --  114*  --   CO2 22  --  20*  --   GLUCOSE 96  --  87  --   BUN 18  --  18  --   CREATININE 1.92*  --  0.92  --   CALCIUM 9.0  --  6.8*  --   MG  --  1.4*  --  2.2  PHOS  --  2.5  --   --     GFR: Estimated Creatinine Clearance: 66.6 mL/min (by C-G formula based on SCr of 0.92 mg/dL).  Liver Function Tests: Recent Labs  Lab 07/19/21 0206  AST 59*  ALT 31  ALKPHOS 144*  BILITOT 1.2  PROT 7.3  ALBUMIN 4.3    CBG: No results for input(s): GLUCAP in the last 168 hours.   Recent Results (from the past 240 hour(s))  Urine Culture     Status: None (Preliminary result)   Collection Time: 07/19/21  3:33 AM   Specimen: Urine, Clean Catch  Result Value Ref Range Status   Specimen Description   Final    URINE, CLEAN CATCH Performed at [facility], [address] [place], [place] [address]    Special Requests   Final    NONE Performed at [facility], [address], [place], [place] [address]    Culture   Final    CULTURE REINCUBATED FOR BETTER GROWTH Performed at [facility], [place] [address], [place], [place] [address]    Report Status PENDING  Incomplete  Culture, blood (single) w Reflex to ID Panel     Status: None (Preliminary result)   Collection Time: 07/19/21  7:56 AM   Specimen: Left Antecubital; Blood  Result Value Ref Range Status   Specimen Description   Final    LEFT ANTECUBITAL BOTTLES DRAWN AEROBIC AND ANAEROBIC   Special Requests Blood Culture adequate volume  Final   Culture   Final    NO GROWTH < 24 HOURS Performed at [facility], [address]  [place], [place] [address]    Report Status PENDING  Incomplete  Resp Panel by RT-PCR (Flu A&B, Covid) Nasopharyngeal Swab     Status: None   Collection Time: 07/19/21  8:10 AM   Specimen: Nasopharyngeal Swab; Nasopharyngeal(NP) swabs in vial transport medium  Result Value Ref Range Status   SARS Coronavirus 2 by RT PCR NEGATIVE NEGATIVE Final    Comment: (NOTE) SARS-CoV-2 target nucleic acids are NOT DETECTED.  The SARS-CoV-2 RNA is generally detectable in upper respiratory specimens during the acute phase of infection. The lowest concentration of SARS-CoV-2 viral copies this assay can detect is 138 copies/mL. A negative result does not preclude SARS-Cov-2 infection and should not be used as the sole basis for treatment or other patient management decisions. A negative result may occur with  improper specimen collection/handling, submission of specimen other than nasopharyngeal swab, presence of viral mutation(s) within the areas targeted by this assay, and inadequate number of viral copies(<138 copies/mL). A negative result must be combined with clinical observations, patient history, and epidemiological information. The expected result is Negative.  Fact Sheet for Patients:  EntrepreneurPulse.com.au  Fact Sheet for Healthcare Providers:  IncredibleEmployment.be  This test is no t yet approved or cleared by the [place] FDA and  has been authorized for detection and/or diagnosis of SARS-CoV-2 by FDA under an Emergency Use Authorization (EUA). This EUA will remain  in effect (meaning this test can be used) for the duration of the COVID-19 declaration under Section 564(b)(1) of the Act, 21 U.S.C.section 360bbb-3(b)(1), unless the authorization is terminated  or revoked sooner.       Influenza A by PCR NEGATIVE NEGATIVE Final   Influenza B by PCR NEGATIVE NEGATIVE Final    Comment: (NOTE) The Xpert Xpress SARS-CoV-2/FLU/RSV plus assay is  intended as an aid in the diagnosis of influenza from Nasopharyngeal swab specimens and should not be used as a sole basis for treatment. Nasal washings and aspirates are unacceptable for Xpert Xpress SARS-CoV-2/FLU/RSV testing.  Fact Sheet for Patients: EntrepreneurPulse.com.au  Fact Sheet for Healthcare Providers: IncredibleEmployment.be  This test is not yet approved or cleared by the [place] FDA and has been authorized for detection and/or diagnosis of SARS-CoV-2 by FDA under an Emergency Use Authorization (EUA). This EUA will remain in effect (meaning this test can be used) for the duration of the COVID-19 declaration under Section 564(b)(1) of the Act, 21 U.S.C. section 360bbb-3(b)(1), unless the authorization is terminated or revoked.  Performed at [facility], [address] [place], [place] [address]      Radiology Studies: CT RENAL STONE STUDY  Result Date: 07/19/2021 CLINICAL DATA:  Acute left-sided abdominal pain. EXAM: CT ABDOMEN AND PELVIS WITHOUT CONTRAST TECHNIQUE: Multidetector CT imaging of the abdomen and pelvis was performed following the standard protocol without IV contrast. COMPARISON:  None available currently. FINDINGS: Lower chest: No acute abnormality. Hepatobiliary: No gallstones or biliary dilatation is noted. Small calcified granulomas are noted throughout hepatic parenchyma. Pancreas: Unremarkable. No pancreatic ductal dilatation or surrounding inflammatory changes. Spleen: Normal in size without focal abnormality. Adrenals/Urinary Tract: Adrenal glands appear normal. Bilateral nephrolithiasis is noted, including 8 mm calculus in lower pole collecting system of left kidney. Mild left hydroureteronephrosis with perinephric stranding is noted without obstructing calculus. Urinary bladder is decompressed. Stomach/Bowel: Stomach is within normal limits. Appendix appears normal. No evidence of bowel wall thickening,  distention, or inflammatory changes. Vascular/Lymphatic: No significant vascular findings are present. No enlarged abdominal or pelvic lymph nodes. Reproductive: Intrauterine device is noted. No adnexal abnormality is noted. Other: No abdominal wall hernia or abnormality. No abdominopelvic ascites. Musculoskeletal: No acute or significant osseous findings. IMPRESSION: Bilateral nephrolithiasis. Mild left hydroureteronephrosis with perinephric stranding is noted without evidence of obstructing calculus. Electronically Signed   By: Marijo Conception M.D.   On: 07/19/2021 06:26     Scheduled Meds:  enoxaparin (LOVENOX) injection  40 mg Subcutaneous Q24H   pantoprazole  40 mg Oral Daily   Continuous Infusions:  0.9 % NaCl with KCl 40 mEq / L 100 mL/hr at 07/20/21 0845   cefTRIAXone (ROCEPHIN)  IV 1 g (07/20/21 0517)     LOS: 1 day    Time spent: 35 minutes.    Barton Dubois, MD [facility]   To contact the attending provider between 7A-7P  or the covering provider during after hours 7P-7A, please log into the web site [email or web address] and access using universal [place] password for that web site. If you do not have the password, please call the hospital operator.  07/20/2021, 3:49 PM

## 2021-07-21 DIAGNOSIS — N390 Urinary tract infection, site not specified: Secondary | ICD-10-CM

## 2021-07-21 DIAGNOSIS — A4150 Gram-negative sepsis, unspecified: Secondary | ICD-10-CM

## 2021-07-21 DIAGNOSIS — K219 Gastro-esophageal reflux disease without esophagitis: Secondary | ICD-10-CM

## 2021-07-21 LAB — BASIC METABOLIC PANEL
Anion gap: 1 — ABNORMAL LOW (ref 5–15)
BUN: 11 mg/dL (ref 6–20)
CO2: 20 mmol/L — ABNORMAL LOW (ref 22–32)
Calcium: 7.8 mg/dL — ABNORMAL LOW (ref 8.9–10.3)
Chloride: 115 mmol/L — ABNORMAL HIGH (ref 98–111)
Creatinine, Ser: 0.8 mg/dL (ref 0.44–1.00)
GFR, Estimated: >60 mL/min (ref >60)
Glucose, Bld: 89 mg/dL (ref 70–99)
Potassium: 4.6 mmol/L (ref 3.5–5.1)
Sodium: 136 mmol/L (ref 135–145)

## 2021-07-21 LAB — CBC
HCT: 32.6 % — ABNORMAL LOW (ref 36.0–46.0)
Hemoglobin: 10.7 g/dL — ABNORMAL LOW (ref 12.0–15.0)
MCH: 31.6 pg (ref 26.0–34.0)
MCHC: 32.8 g/dL (ref 30.0–36.0)
MCV: 96.2 fL (ref 80.0–100.0)
Platelets: 186 10*3/uL (ref 150–400)
RBC: 3.39 MIL/uL — ABNORMAL LOW (ref 3.87–5.11)
RDW: 12.8 % (ref 11.5–15.5)
WBC: 29.4 10*3/uL — ABNORMAL HIGH (ref 4.0–10.5)
nRBC: 0 % (ref 0.0–0.2)

## 2021-07-21 MED ORDER — PANTOPRAZOLE SODIUM 40 MG PO TBEC
40.0000 mg | DELAYED_RELEASE_TABLET | Freq: Every day | ORAL | 0 refills | Status: AC
Start: 2021-07-22 — End: ?

## 2021-07-21 MED ORDER — ONDANSETRON 8 MG PO TBDP: 8.0000 mg | ORAL_TABLET | Freq: Three times a day (TID) | ORAL | 0 refills | Status: AC | PRN

## 2021-07-21 MED ORDER — CEFDINIR 300 MG PO CAPS: 300.0000 mg | ORAL_CAPSULE | Freq: Two times a day (BID) | ORAL | 0 refills | Status: AC

## 2021-07-21 NOTE — Progress Notes (Signed)
Discharge instructions given. Patient verbalized understanding. Awaiting ride to arrive for discharge.

## 2021-07-21 NOTE — TOC Transition Note (Signed)
Transition of Care [facility]) - CM/SW Discharge Note   Patient Details  Name: Kirsten West MRN: [id number] Date of Birth: 1981/02/06  Transition of Care [facility]) CM/SW Contact:  Shade Flood, LCSW Phone Number: 07/21/2021, 4:28 PM   Clinical Narrative:     Pt stable for dc today per MD. Pt needs follow up care and does not have a PCP or insurance. Spoke with pt about dc needs. Reviewed prescription cost with pt who states she will be able to afford them. Scheduled pt for follow up appointment at the [facility] in [place] on Tuesday 07/29/21 at [place] summary faxed to the clinic at their request. This information was given to patient verbally and written on her dc paperwork. Encouraged pt to follow up with the clinic for further care.  No other TOC needs for dc.  Expected Discharge Plan: Home/Self Care Barriers to Discharge: Barriers Resolved   Patient Goals and CMS Choice Patient states their goals for this hospitalization and ongoing recovery are:: go home      Expected Discharge Plan and Services Expected Discharge Plan: Home/Self Care In-house Referral: Clinical Social Work Discharge Planning Services: [place] arrangements for the past 2 months: Single Family Home Expected Discharge Date: 07/21/21                                    Prior Living Arrangements/Services Living arrangements for the past 2 months: Single Family Home Lives with:: Self Patient language and need for interpreter reviewed:: Yes Do you feel safe going back to the place where you live?: Yes      Need for Family Participation in Patient Care: No (Comment) Care giver support system in place?: Yes (comment)   Criminal Activity/Legal Involvement Pertinent to Current Situation/Hospitalization: No - Comment as needed  Activities of Daily Living Home Assistive Devices/Equipment: None ADL Screening (condition at time of admission) Patient's cognitive ability  adequate to safely complete daily activities?: Yes Is the patient deaf or have difficulty hearing?: No Does the patient have difficulty seeing, even when wearing glasses/contacts?: No Does the patient have difficulty concentrating, remembering, or making decisions?: No Patient able to express need for assistance with ADLs?: Yes Does the patient have difficulty dressing or bathing?: No Independently performs ADLs?: Yes (appropriate for developmental age) Does the patient have difficulty walking or climbing stairs?: No Weakness of Legs: None Weakness of Arms/Hands: None  Permission Sought/Granted                  Emotional Assessment Appearance:: Appears stated age Attitude/Demeanor/Rapport: Engaged Affect (typically observed): Pleasant Orientation: : Oriented to Self, Oriented to Place, Oriented to  Time, Oriented to Situation Alcohol / Substance Use: Not Applicable Psych Involvement: No (comment)  Admission diagnosis:  UTI (urinary tract infection) [N39.0] Urinary tract infection without hematuria, site unspecified [N39.0] Patient Active Problem List   Diagnosis Date Noted   Sepsis due to gram-negative UTI ([place])    Gastroesophageal reflux disease    UTI (urinary tract infection) 07/19/2021   AKI (acute kidney injury) ([place]) 07/19/2021   Hypokalemia 07/19/2021   Nausea & vomiting 07/19/2021   Nephrolithiasis 07/19/2021   PCP:  Patient, No Pcp Per (Inactive) Pharmacy:   [facility] [address], [place] [address] Phone: [phone] Fax: [phone]     Social Determinants of Health (SDOH) Interventions  Readmission Risk Interventions Readmission Risk Prevention Plan 07/21/2021  Post Dischage Appt Complete  Medication Screening Complete  Transportation Screening Complete  Some recent data might be hidden     Final next level of care: Home/Self Care Barriers to Discharge: Barriers Resolved   Patient Goals and CMS  Choice Patient states their goals for this hospitalization and ongoing recovery are:: go home      Discharge Placement                       Discharge Plan and Services In-house Referral: Clinical Social Work [facility]charge Planning Services: [facility]                                 Social Determinants of Health (SDOH) Interventions     Readmission Risk Interventions Readmission Risk Prevention Plan 07/21/2021  Post Dischage Appt Complete  Medication Screening Complete  Transportation Screening Complete  Some recent data might be hidden

## 2021-07-21 NOTE — Discharge Summary (Signed)
Physician Discharge Summary  Kirsten West [id number] DOB: Dec 14, 1980 DOA: 07/19/2021  PCP: Patient, No Pcp Per (Inactive)  Admit date: 07/19/2021 Discharge date: 07/21/2021  Time spent: 35 minutes  Recommendations for Outpatient Follow-up:  Repeat CBC to follow white blood cell count and hemoglobin and stability. Repeat basic metabolic panel to follow electrolytes and renal function. Repeat magnesium level stability and trend. Referral to urology service for further evaluation and management of nephrolithiasis as required.  Discharge Diagnoses:  Principal Problem:   UTI (urinary tract infection) Active Problems:   AKI (acute kidney injury) (H[facility])   Hypokalemia   Nausea & vomiting   Nephrolithiasis   Sepsis due to gram-negative UTI ([facility])   Gastroesophageal reflux disease   Discharge Condition: Stable and improved.  Discharged home with instruction to follow-up and establish care with a PCP in the next 10 days.  CODE STATUS: Full code.  Diet recommendation: Regular diet.  Filed Weights   07/19/21 0149 07/19/21 1115  Weight: 53.5 kg 56.2 kg    History of present illness:  Kirsten West is a 40 y.o. female with medical history significant of history of nephrolithiasis and gastroesophageal reflux disease; who presented to the hospital secondary to left flank pain radiating to her groin, increased frequency, nausea and vomiting.  Symptom has been present for the last 2 days and worsening.  Patient reports no fever.  She also denies chest pain, shortness of breath, hematuria, melena, hematochezia, focal weakness, sick contacts or any other complaints. Patient is now vaccinated against COVID; COVID PCR in the emergency department negative.   ED Course: Mild tachycardia, acute kidney injury with creatinine up to 1.9; potassium 3.0, WBCs 10.6, patient complaining of flank pain requiring IV analgesics.  Urinalysis suggesting UTI; lactic acid was checked and elevated at 2.4.   Cultures (urine and blood) taken; empirically antibiotics using Rocephin initiated and IV fluids provided.  TRH has been called to place in the hospital for further evaluation and management.  Hospital Course:  1-SIRS/sepsis due to UTI -Urinalysis culture demonstrating E. coli as microorganism causing infection. -Blood cultures remain without growth. -WBCs trending down, no fever, no nausea/vomiting and patient reports no dysuria at time of discharge. -Acute kidney injury present at time of admission resolved. -Patient advised to keep yourself well-hydrated and to complete antibiotics orally as prescribed -Outpatient follow-up with PCP in 10 days.  2-leukocytosis -Trending down appropriately after receiving fluid resuscitation and antibiotics -Patient is afebrile. -Repeat CBC at follow-up visit to assess resolution/stability of her white blood cell counts.  3-acute kidney injury -In the setting of prerenal azotemia and UTI -Resolved and within normal limits at time of discharge -Patient advised to maintain herself well-hydrated.  4-hypokalemia/hypomagnesemia -Repleted and within normal limits at discharge -Repeat basic metabolic panel and magnesium level at follow-up visit to assess electrolytes stability.  5-gastroesophageal reflux disease -Continue PPI.  6-nephrolithiasis -Nonobstructive in origin -Outpatient follow-up with urology service recommended.  Procedures: See below for x-ray reports.  Consultations: None  Discharge Exam: Vitals:   07/21/21 0534 07/21/21 1234  BP: 111/82 (!) 132/94  Pulse: 80 67  Resp: 18 16  Temp: 98 F (36.7 C) 97.6 F (36.4 C)  SpO2: 95% 96%    General: Afebrile, no chest pain, no nausea, no vomiting, reports feeling significantly better and would like to go home.  Denying dysuria.  Minimal left flank discomfort. Cardiovascular: S1 and S2, no rubs, no gallops, no JVD. Respiratory: Clear to auscultation bilaterally Abdomen: Soft, not  distended, positive bowel  sounds with just minimal discomfort on deep palpation in her left flank. Extremities: No cyanosis or clubbing.  Discharge Instructions   Discharge Instructions     Discharge instructions   Complete by: As directed    Continue to maintain adequate hydration Arrange/establish care with PCP to be seen in approximately 10 days. Take medications as prescribed.      Allergies as of 07/21/2021   No Known Allergies      Medication List     STOP taking these medications    phenazopyridine 200 MG tablet Commonly known as: Pyridium       TAKE these medications    acetaminophen 500 MG tablet Commonly known as: TYLENOL Take 1,000 mg by mouth every 6 (six) hours as needed.   cefdinir 300 MG capsule Commonly known as: OMNICEF Take 1 capsule (300 mg total) by mouth 2 (two) times daily for 5 days.   levonorgestrel 20 MCG/24HR IUD Commonly known as: MIRENA 1 each by Intrauterine route once.   multivitamin tablet Take 1 tablet by mouth daily.   ondansetron 8 MG disintegrating tablet Commonly known as: ZOFRAN-ODT Take 1 tablet (8 mg total) by mouth every 8 (eight) hours as needed for nausea or vomiting.   pantoprazole 40 MG tablet Commonly known as: PROTONIX Take 1 tablet (40 mg total) by mouth daily. Start taking on: July 22, 2021       No Known Allergies   The results of significant diagnostics from this hospitalization (including imaging, microbiology, ancillary and laboratory) are listed below for reference.    Significant Diagnostic Studies: CT RENAL STONE STUDY  Result Date: 07/19/2021 CLINICAL DATA:  Acute left-sided abdominal pain. EXAM: CT ABDOMEN AND PELVIS WITHOUT CONTRAST TECHNIQUE: Multidetector CT imaging of the abdomen and pelvis was performed following the standard protocol without IV contrast. COMPARISON:  None available currently. FINDINGS: Lower chest: No acute abnormality. Hepatobiliary: No gallstones or biliary  dilatation is noted. Small calcified granulomas are noted throughout hepatic parenchyma. Pancreas: Unremarkable. No pancreatic ductal dilatation or surrounding inflammatory changes. Spleen: Normal in size without focal abnormality. Adrenals/Urinary Tract: Adrenal glands appear normal. Bilateral nephrolithiasis is noted, including 8 mm calculus in lower pole collecting system of left kidney. Mild left hydroureteronephrosis with perinephric stranding is noted without obstructing calculus. Urinary bladder is decompressed. Stomach/Bowel: Stomach is within normal limits. Appendix appears normal. No evidence of bowel wall thickening, distention, or inflammatory changes. Vascular/Lymphatic: No significant vascular findings are present. No enlarged abdominal or pelvic lymph nodes. Reproductive: Intrauterine device is noted. No adnexal abnormality is noted. Other: No abdominal wall hernia or abnormality. No abdominopelvic ascites. Musculoskeletal: No acute or significant osseous findings. IMPRESSION: Bilateral nephrolithiasis. Mild left hydroureteronephrosis with perinephric stranding is noted without evidence of obstructing calculus. Electronically Signed   By: Marijo Conception M.D.   On: 07/19/2021 06:26    Microbiology: Recent Results (from the past 240 hour(s))  Urine Culture     Status: Abnormal (Preliminary result)   Collection Time: 07/19/21  3:33 AM   Specimen: Urine, Clean Catch  Result Value Ref Range Status   Specimen Description   Final    URINE, CLEAN CATCH Performed at [facility], [address], [place], [place] [address]    Special Requests   Final    NONE Performed at [facility], [address], [place], [place] [address]    Culture (A)  Final    >=100,000 COLONIES/mL ESCHERICHIA COLI SUSCEPTIBILITIES TO FOLLOW Performed at [facility], [place] [address]  [place], [place] [address]    Report Status PENDING  Incomplete  Culture, blood (single) w Reflex to ID Panel     Status: None  (Preliminary result)   Collection Time: 07/19/21  7:56 AM   Specimen: Left Antecubital; Blood  Result Value Ref Range Status   Specimen Description   Final    LEFT ANTECUBITAL BOTTLES DRAWN AEROBIC AND ANAEROBIC   Special Requests Blood Culture adequate volume  Final   Culture   Final    NO GROWTH 2 DAYS Performed at [facility], [address], [place], [place] [address]    Report Status PENDING  Incomplete  Resp Panel by RT-PCR (Flu A&B, Covid) Nasopharyngeal Swab     Status: None   Collection Time: 07/19/21  8:10 AM   Specimen: Nasopharyngeal Swab; Nasopharyngeal(NP) swabs in vial transport medium  Result Value Ref Range Status   SARS Coronavirus 2 by RT PCR NEGATIVE NEGATIVE Final    Comment: (NOTE) SARS-CoV-2 target nucleic acids are NOT DETECTED.  The SARS-CoV-2 RNA is generally detectable in upper respiratory specimens during the acute phase of infection. The lowest concentration of SARS-CoV-2 viral copies this assay can detect is 138 copies/mL. A negative result does not preclude SARS-Cov-2 infection and should not be used as the sole basis for treatment or other patient management decisions. A negative result may occur with  improper specimen collection/handling, submission of specimen other than nasopharyngeal swab, presence of viral mutation(s) within the areas targeted by this assay, and inadequate number of viral copies(<138 copies/mL). A negative result must be combined with clinical observations, patient history, and epidemiological information. The expected result is Negative.  Fact Sheet for Patients:  EntrepreneurPulse.com.au  Fact Sheet for Healthcare Providers:  IncredibleEmployment.be  This test is no t yet approved or cleared by the [place] FDA and  has been authorized for detection and/or diagnosis of SARS-CoV-2 by FDA under an Emergency Use Authorization (EUA). This EUA will remain  in effect (meaning this test  can be used) for the duration of the COVID-19 declaration under Section 564(b)(1) of the Act, 21 U.S.C.section 360bbb-3(b)(1), unless the authorization is terminated  or revoked sooner.       Influenza A by PCR NEGATIVE NEGATIVE Final   Influenza B by PCR NEGATIVE NEGATIVE Final    Comment: (NOTE) The Xpert Xpress SARS-CoV-2/FLU/RSV plus assay is intended as an aid in the diagnosis of influenza from Nasopharyngeal swab specimens and should not be used as a sole basis for treatment. Nasal washings and aspirates are unacceptable for Xpert Xpress SARS-CoV-2/FLU/RSV testing.  Fact Sheet for Patients: EntrepreneurPulse.com.au  Fact Sheet for Healthcare Providers: IncredibleEmployment.be  This test is not yet approved or cleared by the [place] FDA and has been authorized for detection and/or diagnosis of SARS-CoV-2 by FDA under an Emergency Use Authorization (EUA). This EUA will remain in effect (meaning this test can be used) for the duration of the COVID-19 declaration under Section 564(b)(1) of the Act, 21 U.S.C. section 360bbb-3(b)(1), unless the authorization is terminated or revoked.  Performed at [facility], [address] [place], [place] [address]      Labs: Basic Metabolic Panel: Recent Labs  Lab 07/19/21 0206 07/19/21 0757 07/20/21 0603 07/20/21 0843 07/21/21 0438  NA 137  --  138  --  136  K 3.0*  --  4.9  --  4.6  CL 102  --  114*  --  115*  CO2 22  --  20*  --  20*  GLUCOSE  96  --  87  --  89  BUN 18  --  18  --  11  CREATININE 1.92*  --  0.92  --  0.80  CALCIUM 9.0  --  6.8*  --  7.8*  MG  --  1.4*  --  2.2  --   PHOS  --  2.5  --   --   --    Liver Function Tests: Recent Labs  Lab 07/19/21 0206  AST 59*  ALT 31  ALKPHOS 144*  BILITOT 1.2  PROT 7.3  ALBUMIN 4.3   Recent Labs  Lab 07/19/21 0206  LIPASE 34    CBC: Recent Labs  Lab 07/19/21 0206 07/20/21 0603 07/21/21 0438  WBC 10.6* 37.0*  29.4*  NEUTROABS 9.9*  --   --   HGB 15.8* 10.5* 10.7*  HCT 46.4* 32.1* 32.6*  MCV 94.1 93.9 96.2  PLT 311 170 186   Signed:  Barton Dubois MD.  [facility] 07/21/2021, 3:34 PM

## 2021-07-22 LAB — URINE CULTURE: Culture: >=100000 — AB

## 2021-07-24 LAB — CULTURE, BLOOD (SINGLE)
Culture: NO GROWTH
Special Requests: ADEQUATE

## 2021-07-30 ENCOUNTER — Other Ambulatory Visit (HOSPITAL_COMMUNITY): Payer: Self-pay | Admitting: Family Medicine

## 2021-07-30 DIAGNOSIS — Z1231 Encounter for screening mammogram for malignant neoplasm of breast: Secondary | ICD-10-CM

## 2023-01-31 IMAGING — CT CT RENAL STONE PROTOCOL
2 of 4 series · 16 of 46 positions shown, 18 images · non-contrast
Comparison: None available currently.

CLINICAL DATA: Acute left-sided abdominal pain.

EXAM:
CT ABDOMEN AND PELVIS WITHOUT CONTRAST
TECHNIQUE: Multidetector CT imaging of the abdomen and pelvis was performed
following the standard protocol without IV contrast.

[Series 2: axial st · axial · 0.69mm/px · z∈[-244,+176]mm · 13 of 94 slices shown, 15 images]
[im 5/94  soft-tissue]
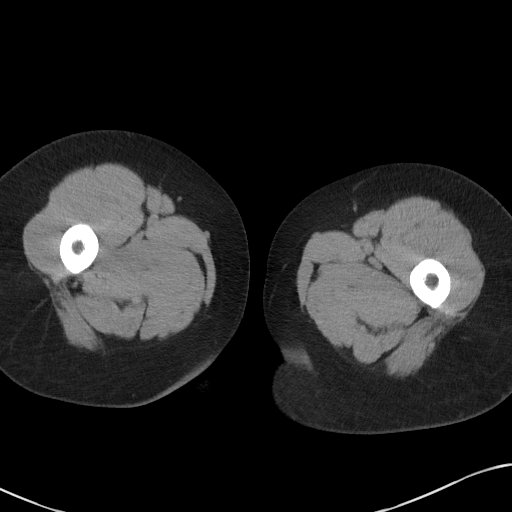
[im 5/94  bone]
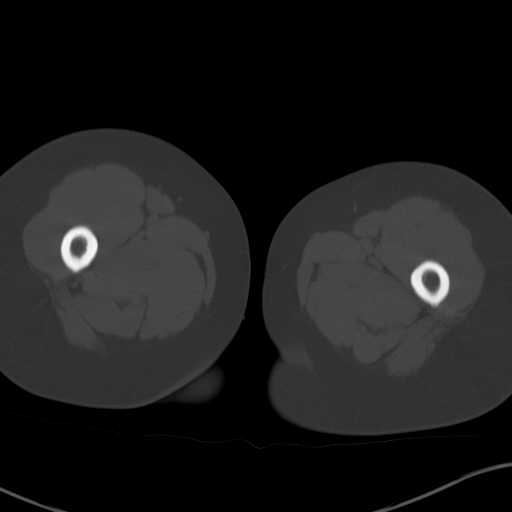
[im 14/94  soft-tissue]
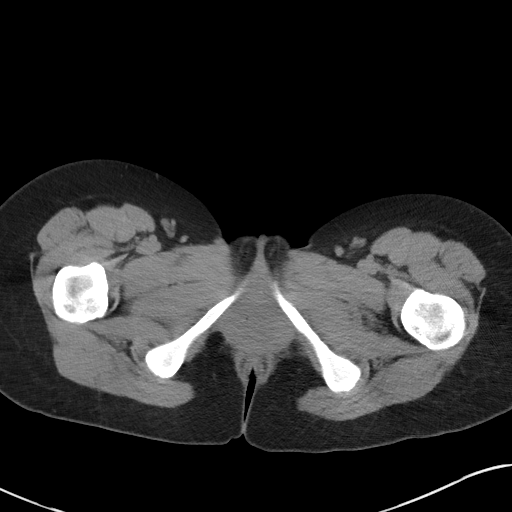
[im 19/94  soft-tissue]
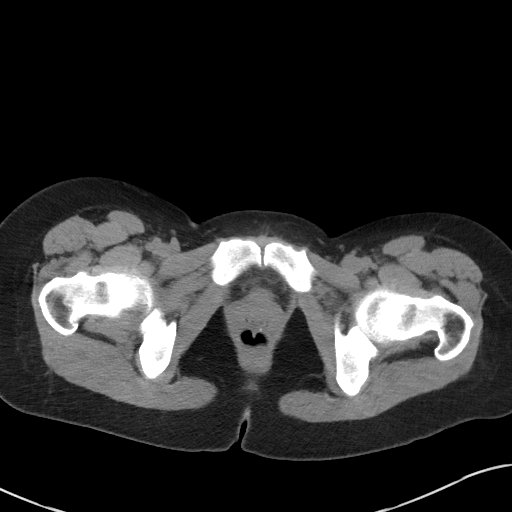
[im 28/94  soft-tissue]
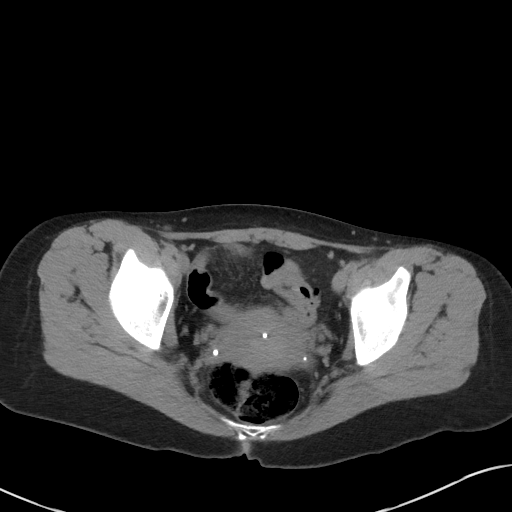
[im 33/94  soft-tissue]
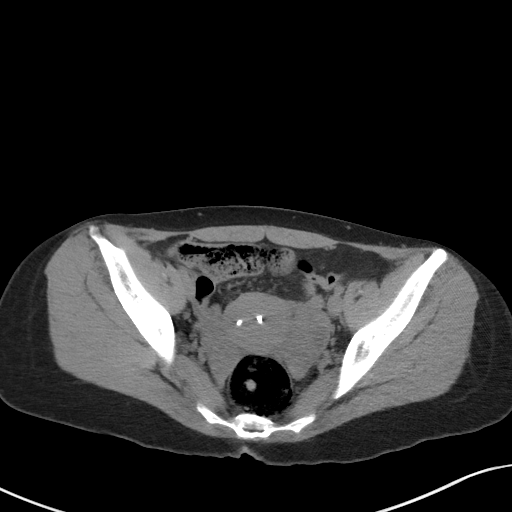
[im 42/94  soft-tissue]
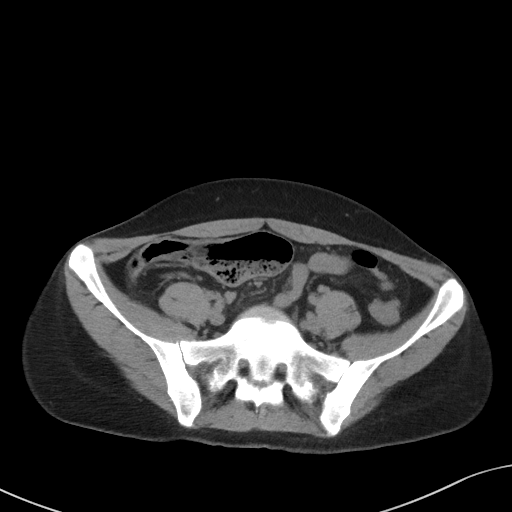
[im 47/94  soft-tissue]
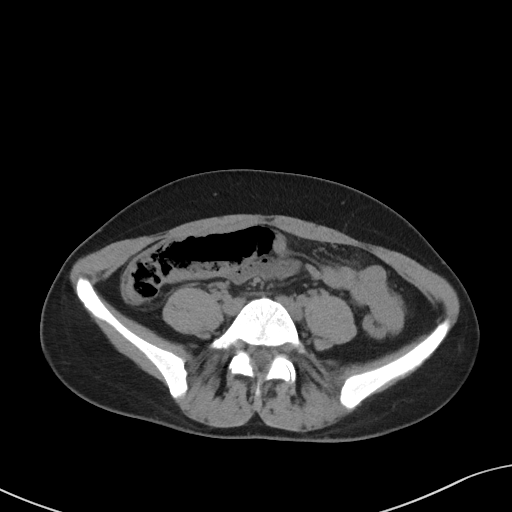
[im 52/94  soft-tissue]
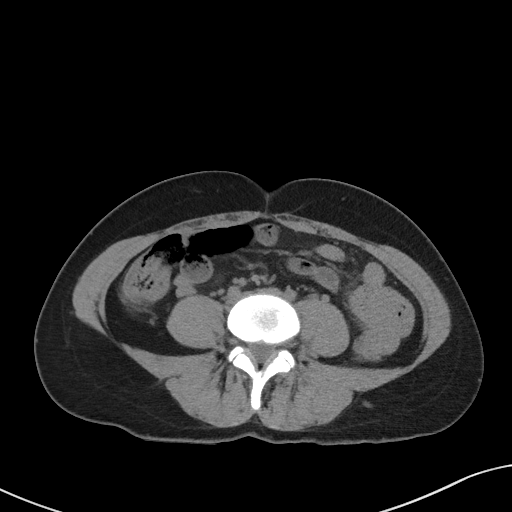
[im 61/94  soft-tissue]
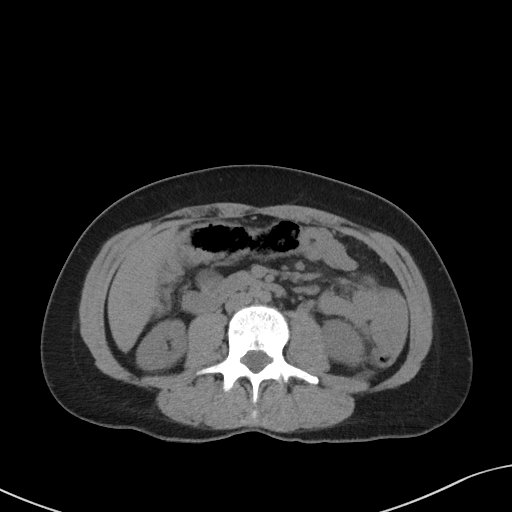
[im 61/94  bone]
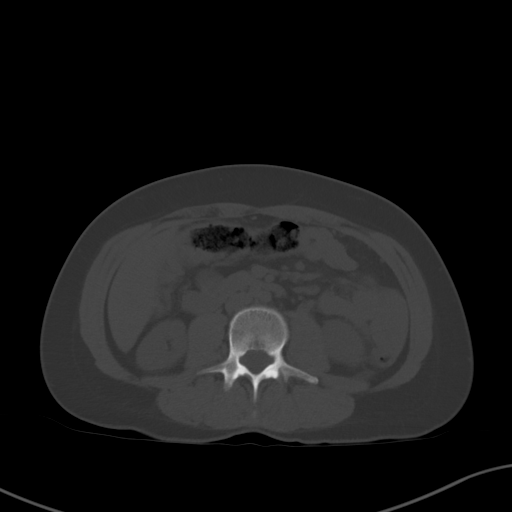
[im 66/94  soft-tissue]
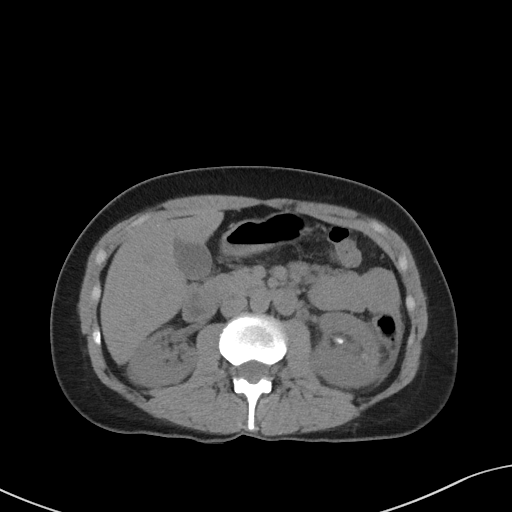
[im 75/94  soft-tissue]
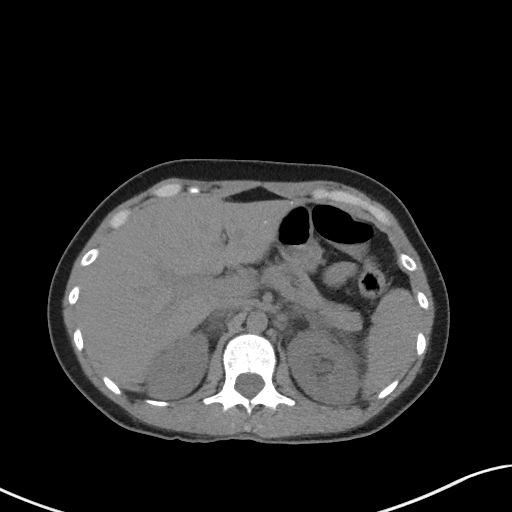
[im 80/94  soft-tissue]
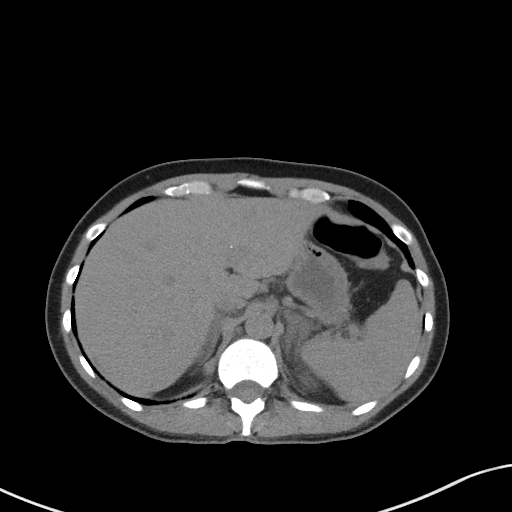
[im 89/94  soft-tissue]
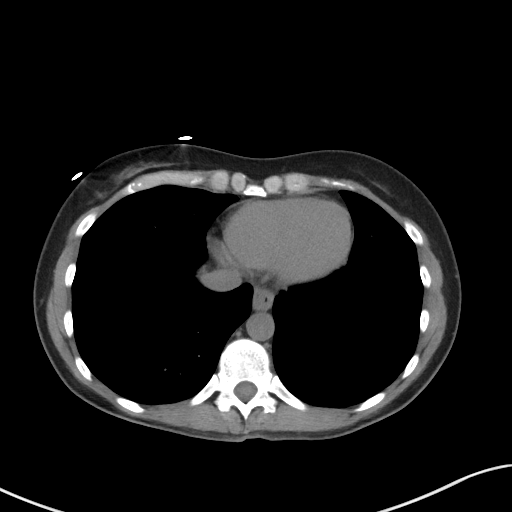

[Series 5: coronal st · coronal · 0.70mm/px · 3 of 79 slices shown]
[im 27/79  soft-tissue]
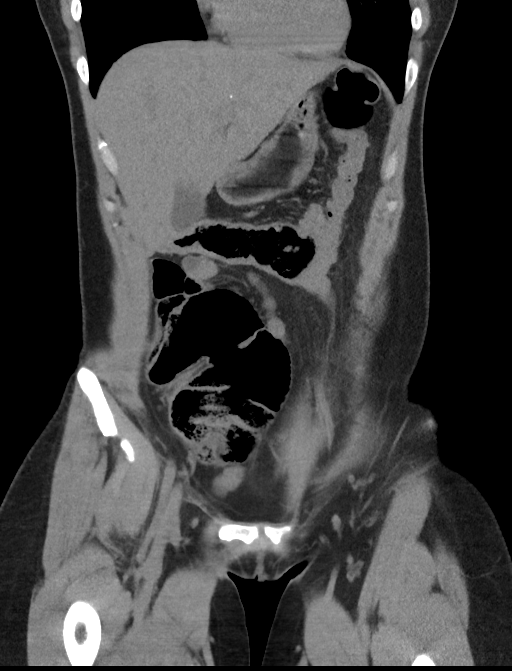
[im 35/79  soft-tissue]
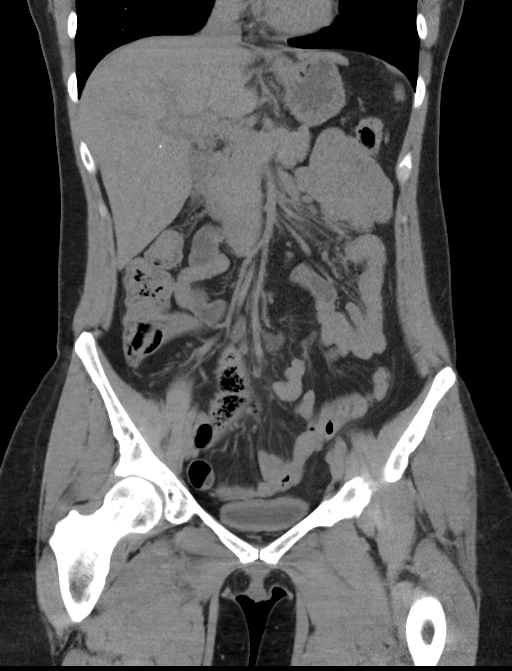
[im 44/79  soft-tissue]
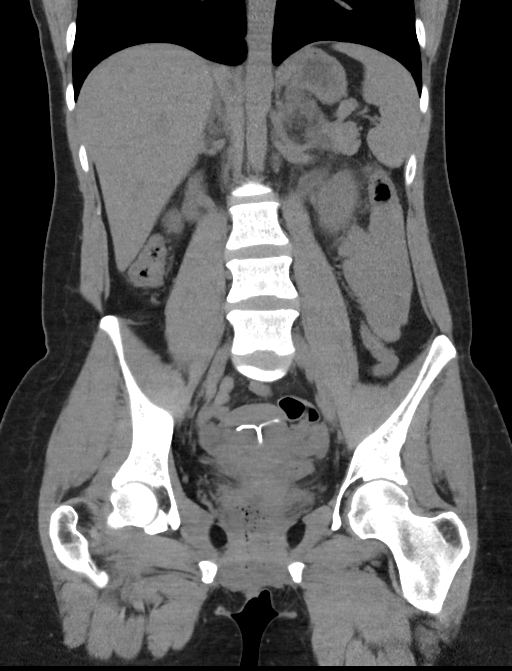

[16 of 46 positions shown; findings below may reference images not displayed]

FINDINGS: Lower chest: No acute abnormality.

Hepatobiliary: No gallstones or biliary dilatation is noted. Small
calcified granulomas are noted throughout hepatic parenchyma.

Pancreas: Unremarkable. No pancreatic ductal dilatation or
surrounding inflammatory changes.

Spleen: Normal in size without focal abnormality.

Adrenals/Urinary Tract: Adrenal glands appear normal. Bilateral
nephrolithiasis is noted, including 8 mm calculus in lower pole
collecting system of left kidney. Mild left hydroureteronephrosis
with perinephric stranding is noted without obstructing calculus.
Urinary bladder is decompressed.

Stomach/Bowel: Stomach is within normal limits. Appendix appears
normal. No evidence of bowel wall thickening, distention, or
inflammatory changes.

Vascular/Lymphatic: No significant vascular findings are present. No
enlarged abdominal or pelvic lymph nodes.

Reproductive: Intrauterine device is noted. No adnexal abnormality
is noted.

Other: No abdominal wall hernia or abnormality. No abdominopelvic
ascites.

Musculoskeletal: No acute or significant osseous findings.
IMPRESSION: Bilateral nephrolithiasis.

Mild left hydroureteronephrosis with perinephric stranding is noted
without evidence of obstructing calculus.
